# Patient Record
Sex: Female | Born: 1950 | Race: White | Hispanic: No | Marital: Married | Smoking: Never smoker
Health system: Southern US, Community
[De-identification: ages and names within clinical notes are randomized; demographics above are authoritative.]

## PROBLEM LIST (undated history)

## (undated) DIAGNOSIS — Z8489 Family history of other specified conditions: Secondary | ICD-10-CM

## (undated) DIAGNOSIS — R112 Nausea with vomiting, unspecified: Secondary | ICD-10-CM

## (undated) DIAGNOSIS — R51 Headache: Secondary | ICD-10-CM

## (undated) DIAGNOSIS — Z9889 Other specified postprocedural states: Secondary | ICD-10-CM

## (undated) DIAGNOSIS — E785 Hyperlipidemia, unspecified: Secondary | ICD-10-CM

## (undated) DIAGNOSIS — T148XXA Other injury of unspecified body region, initial encounter: Secondary | ICD-10-CM

## (undated) DIAGNOSIS — N209 Urinary calculus, unspecified: Secondary | ICD-10-CM

## (undated) DIAGNOSIS — T8859XA Other complications of anesthesia, initial encounter: Secondary | ICD-10-CM

## (undated) DIAGNOSIS — T4145XA Adverse effect of unspecified anesthetic, initial encounter: Secondary | ICD-10-CM

## (undated) DIAGNOSIS — K219 Gastro-esophageal reflux disease without esophagitis: Secondary | ICD-10-CM

## (undated) DIAGNOSIS — R519 Headache, unspecified: Secondary | ICD-10-CM

## (undated) DIAGNOSIS — M1990 Unspecified osteoarthritis, unspecified site: Secondary | ICD-10-CM

## (undated) DIAGNOSIS — Z87442 Personal history of urinary calculi: Secondary | ICD-10-CM

## (undated) DIAGNOSIS — I10 Essential (primary) hypertension: Secondary | ICD-10-CM

## (undated) DIAGNOSIS — E039 Hypothyroidism, unspecified: Secondary | ICD-10-CM

## (undated) HISTORY — PX: TOOTH EXTRACTION: SUR596

## (undated) HISTORY — PX: DILATION AND CURETTAGE, DIAGNOSTIC / THERAPEUTIC: SUR384

## (undated) HISTORY — DX: Other injury of unspecified body region, initial encounter: T14.8XXA

## (undated) HISTORY — DX: Unspecified osteoarthritis, unspecified site: M19.90

## (undated) HISTORY — DX: Headache, unspecified: R51.9

## (undated) HISTORY — DX: Essential (primary) hypertension: I10

## (undated) HISTORY — DX: Headache: R51

## (undated) HISTORY — DX: Urinary calculus, unspecified: N20.9

## (undated) HISTORY — PX: TUBAL LIGATION: SHX77

## (undated) HISTORY — DX: Hyperlipidemia, unspecified: E78.5

## (undated) HISTORY — DX: Hypothyroidism, unspecified: E03.9

---

## 1981-12-23 HISTORY — PX: SALPINGOOPHORECTOMY: SHX82

## 1981-12-23 HISTORY — PX: TOTAL ABDOMINAL HYSTERECTOMY: SHX209

## 1995-12-24 HISTORY — PX: URETERAL STENT PLACEMENT: SHX822

## 2008-07-06 ENCOUNTER — Ambulatory Visit: Payer: Self-pay | Admitting: Unknown Physician Specialty

## 2009-08-29 ENCOUNTER — Ambulatory Visit: Payer: Self-pay | Admitting: Unknown Physician Specialty

## 2010-09-11 ENCOUNTER — Ambulatory Visit: Payer: Self-pay | Admitting: Unknown Physician Specialty

## 2011-12-24 DIAGNOSIS — T148XXA Other injury of unspecified body region, initial encounter: Secondary | ICD-10-CM

## 2011-12-24 HISTORY — PX: COLONOSCOPY: SHX174

## 2011-12-24 HISTORY — DX: Other injury of unspecified body region, initial encounter: T14.8XXA

## 2012-02-04 ENCOUNTER — Ambulatory Visit: Payer: Self-pay | Admitting: Unknown Physician Specialty

## 2015-04-14 DIAGNOSIS — E039 Hypothyroidism, unspecified: Secondary | ICD-10-CM | POA: Insufficient documentation

## 2016-07-02 DIAGNOSIS — G44009 Cluster headache syndrome, unspecified, not intractable: Secondary | ICD-10-CM | POA: Insufficient documentation

## 2016-07-02 DIAGNOSIS — F5101 Primary insomnia: Secondary | ICD-10-CM | POA: Insufficient documentation

## 2016-07-03 DIAGNOSIS — M7989 Other specified soft tissue disorders: Secondary | ICD-10-CM | POA: Insufficient documentation

## 2017-04-01 ENCOUNTER — Other Ambulatory Visit: Payer: Self-pay | Admitting: Certified Nurse Midwife

## 2017-04-07 ENCOUNTER — Other Ambulatory Visit: Payer: Self-pay | Admitting: Certified Nurse Midwife

## 2017-04-08 ENCOUNTER — Other Ambulatory Visit: Payer: Self-pay | Admitting: Certified Nurse Midwife

## 2017-04-08 NOTE — Telephone Encounter (Signed)
Please advise for refill. Thank you.  

## 2017-04-08 NOTE — Telephone Encounter (Signed)
Needs annual scheduled

## 2017-04-08 NOTE — Telephone Encounter (Signed)
Left msg for pt that she needs annual for additional refills.

## 2017-05-14 ENCOUNTER — Encounter: Payer: Self-pay | Admitting: Certified Nurse Midwife

## 2017-05-14 ENCOUNTER — Ambulatory Visit (INDEPENDENT_AMBULATORY_CARE_PROVIDER_SITE_OTHER): Payer: BLUE CROSS/BLUE SHIELD | Admitting: Certified Nurse Midwife

## 2017-05-14 VITALS — BP 128/78 | HR 90 | Ht 64.0 in | Wt 220.0 lb

## 2017-05-14 DIAGNOSIS — M8949 Other hypertrophic osteoarthropathy, multiple sites: Secondary | ICD-10-CM | POA: Insufficient documentation

## 2017-05-14 DIAGNOSIS — M159 Polyosteoarthritis, unspecified: Secondary | ICD-10-CM | POA: Insufficient documentation

## 2017-05-14 DIAGNOSIS — Z1239 Encounter for other screening for malignant neoplasm of breast: Secondary | ICD-10-CM

## 2017-05-14 DIAGNOSIS — Z1231 Encounter for screening mammogram for malignant neoplasm of breast: Secondary | ICD-10-CM

## 2017-05-14 DIAGNOSIS — K219 Gastro-esophageal reflux disease without esophagitis: Secondary | ICD-10-CM | POA: Insufficient documentation

## 2017-05-14 DIAGNOSIS — E782 Mixed hyperlipidemia: Secondary | ICD-10-CM | POA: Insufficient documentation

## 2017-05-14 DIAGNOSIS — Z01419 Encounter for gynecological examination (general) (routine) without abnormal findings: Secondary | ICD-10-CM | POA: Diagnosis not present

## 2017-05-14 DIAGNOSIS — N951 Menopausal and female climacteric states: Secondary | ICD-10-CM | POA: Insufficient documentation

## 2017-05-14 DIAGNOSIS — M150 Primary generalized (osteo)arthritis: Secondary | ICD-10-CM

## 2017-05-14 NOTE — Progress Notes (Signed)
Gynecology Annual Exam  PCP: [facility] Chief Complaint:  Chief Complaint  Patient presents with  . Gynecologic Exam    History of Present Illness: Aimee Hill presents today for her annual exam. She is a 66 year old White female , G 1 P 1 0 0 1 , whose LMP was in 1983 after a TAH and RSO for an ovarian mass . She is currently using Vivelle dot patches 0.0375. Which does control the majority of hot flashes. Since her last visit, her mother passed away from an internal hemorrhage in August 2017.  She has had no spotting.  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  Since her last annual GYN exam dated 02/28/2016, she has been diagnosed with hypertension and hyperlipidemia. She is taking Pravastatin and Verapamil  Her last mammogram was 02/28/2016 and was benign with no significant changes. There is no family history of breast or ovarian cancer. She does do monthly self breast exams She does not have Pap smears since her TAH. She had a DEXA scan done in 2013 that was normal. Her next is due at age 88. The patient does not smoke or drink alcohol. She does not exercise, works 12 hour days. She does not get adequate calcium in her diet. Her current BMI=37.76 kg/m2   The patient denies current symptoms of depression.    Review of Systems: Review of Systems  Constitutional: Negative for chills, fever and weight loss.  HENT: Positive for sinus pain. Negative for congestion and sore throat.   Eyes: Negative for blurred vision and pain.  Respiratory: Negative for hemoptysis, shortness of breath and wheezing.   Cardiovascular: Negative for chest pain, palpitations and leg swelling.  Gastrointestinal: Negative for abdominal pain, blood in stool, diarrhea, heartburn, nausea and vomiting.  Genitourinary: Negative for dysuria, frequency, hematuria and  urgency.  Musculoskeletal: Positive for joint pain and neck pain. Negative for back pain and myalgias.  Skin: Negative for itching and rash.  Neurological: Negative for dizziness, tingling and headaches.  Endo/Heme/Allergies: Negative for environmental allergies and polydipsia. Does not bruise/bleed easily.       Negative for hirsutism   Psychiatric/Behavioral: Negative for depression. The patient is not nervous/anxious and does not have insomnia.     Past Medical History:  Past Medical History:  Diagnosis Date  . Fracture 2013   left foot and second and third toes on left foot  . Headache   . Hyperlipidemia   . Hypertension   . Hypothyroidism   . Osteoarthritis   . Urinary calculus     Past Surgical History:  Past Surgical History:  Procedure Laterality Date  . COLONOSCOPY  2013   negative/Dr. Kathyrn Sheriff [place]-next one due 2023  . DILATION AND CURETTAGE, DIAGNOSTIC / THERAPEUTIC    . 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 TOOTH EXTRACTION    . TOTAL ABDOMINAL HYSTERECTOMY  1983   Dr. Rayford Halsted  . TUBAL LIGATION    . URETERAL STENT PLACEMENT  1997    Family History:  Family History  Problem Relation Age of Onset  . Alzheimer's disease Mother        died at age 6 from internal bleeding  . Rheum arthritis Mother   . Brain cancer Father   . Lymphoma Paternal Aunt 39    Social History:  Social History   Social History  .  Marital status: Married    Spouse name: N/A  . Number of children: 1  . Years of education: 51   Occupational History  . Network engineer    Social History Main Topics  . Smoking status: Never Smoker  . Smokeless tobacco: Never Used  . Alcohol use No  . Drug use: No  . Sexual activity: Yes    Partners: Male    Birth control/ protection: Surgical   Other Topics Concern  . Not on file   Social History Narrative  . No narrative on file    Allergies:  Allergies  Allergen Reactions  . Penicillins Hives     INJECTION ONLY    Medications: Current Outpatient Prescriptions:  .  furosemide (LASIX) 20 MG tablet, TAKE ONE (1) TABLET EACH DAY, Disp: , Rfl:  .  meloxicam (MOBIC) 15 MG tablet, TAKE ONE (1) TABLET EACH DAY, Disp: , Rfl:  .  metaxalone (SKELAXIN) 800 MG tablet, TAKE ONE TABLET BY MOUTH THREE TIMES A DAY AS NEEDED FOR PAIN, Disp: , Rfl:  .  Multiple Vitamin (MULTIVITAMIN) tablet, Take 1 tablet by mouth daily., Disp: , Rfl:  .  omeprazole (PRILOSEC) 20 MG capsule, Take 20 mg by mouth daily., Disp: , Rfl:  .  SUMAtriptan (IMITREX) 50 MG tablet, Take by mouth., Disp: , Rfl:  .  gabapentin (NEURONTIN) 100 MG capsule, , Disp: , Rfl: 4 .  levothyroxine (SYNTHROID, LEVOTHROID) 50 MCG tablet, , Disp: , Rfl: 10 .  pravastatin (PRAVACHOL) 10 MG tablet, , Disp: , Rfl: 10 .  VIVELLE-DOT 0.0375 MG/24HR, APPLY ONE PATCH TRANSDERMAL ROUTE TWICE WEEKLY., Disp: 24 patch, Rfl: 0 Verapamil 120 mgm daily Physical Exam Vitals: Blood pressure 128/78, pulse 90, height 5\' 4"  (1.626 m), weight 220 lb (99.8 kg), BMI 37.76 kg/m2  General: pleasant WF in NAD HEENT: normocephalic, anicteric Neck: no thyroid enlargement, no palpable nodules, no cervical lymphadenopathy  Pulmonary: No increased work of breathing, CTAB Cardiovascular: RRR, without murmur  Breast: Breast symmetrical, no tenderness, no palpable nodules or masses, no skin or nipple retraction present, no nipple discharge.  No axillary, infraclavicular or supraclavicular lymphadenopathy. Abdomen: Soft, non-tender, non-distended, obese.  Umbilicus without lesions.  No hepatomegaly or masses palpable. No evidence of hernia. Genitourinary:  External: Normal external female genitalia.  Normal urethral meatus, normal Bartholin's and Skene's glands.    Vagina: Normal vaginal mucosa, no evidence of prolapse.    Cervix: surgically absent  Uterus: surgically absent  Adnexa: No adnexal masses, non-tender  Rectal: deferred  Lymphatic: no evidence of inguinal  lymphadenopathy Extremities: no edema, erythema, or tenderness Neurologic: Grossly intact Psychiatric: mood appropriate, affect full     Assessment: 66 y.o. G1P1001 well woman exam  Plan:   1) Breast cancer screening - recommend monthly self breast exam. Mammogram was ordered today. 2) Refills for Vivelle Dot 0.0375 sent to pharmacy 3)) Routine healthcare maintenance including cholesterol and diabetes screening managed by PCP  4) Next colonoscopy due 2023 5) Next DEXA scan in 2 years  Dalia Heading, [place]

## 2017-05-18 ENCOUNTER — Encounter: Payer: Self-pay | Admitting: Certified Nurse Midwife

## 2017-05-18 DIAGNOSIS — I10 Essential (primary) hypertension: Secondary | ICD-10-CM | POA: Insufficient documentation

## 2017-05-18 MED ORDER — VIVELLE-DOT 0.0375 MG/24HR TD PTTW: MEDICATED_PATCH | TRANSDERMAL | 3 refills | Status: DC

## 2017-07-07 DIAGNOSIS — K5730 Diverticulosis of large intestine without perforation or abscess without bleeding: Secondary | ICD-10-CM | POA: Insufficient documentation

## 2017-07-07 DIAGNOSIS — E559 Vitamin D deficiency, unspecified: Secondary | ICD-10-CM | POA: Insufficient documentation

## 2017-07-10 DIAGNOSIS — Z8619 Personal history of other infectious and parasitic diseases: Secondary | ICD-10-CM | POA: Insufficient documentation

## 2018-05-29 ENCOUNTER — Other Ambulatory Visit: Payer: Self-pay | Admitting: Certified Nurse Midwife

## 2018-05-29 DIAGNOSIS — Z1231 Encounter for screening mammogram for malignant neoplasm of breast: Secondary | ICD-10-CM

## 2018-06-08 ENCOUNTER — Telehealth: Payer: Self-pay

## 2018-06-08 MED ORDER — VIVELLE-DOT 0.0375 MG/24HR TD PTTW: MEDICATED_PATCH | TRANSDERMAL | 0 refills | Status: DC

## 2018-06-08 NOTE — Telephone Encounter (Signed)
rx sent to last until appt on 07/28/18

## 2018-06-08 NOTE — Telephone Encounter (Signed)
Pt calling for a refill on her patch, she has a annual scheduled for 07/28/2018,

## 2018-06-08 NOTE — Telephone Encounter (Signed)
Tried to call pt to notify rx sent. No answer.

## 2018-06-30 ENCOUNTER — Ambulatory Visit
Admission: RE | Admit: 2018-06-30 | Discharge: 2018-06-30 | Disposition: A | Discharge status: Home / Self Care | Payer: BLUE CROSS/BLUE SHIELD | Source: Ambulatory Visit | Attending: Certified Nurse Midwife | Admitting: Certified Nurse Midwife

## 2018-06-30 DIAGNOSIS — Z1231 Encounter for screening mammogram for malignant neoplasm of breast: Secondary | ICD-10-CM | POA: Insufficient documentation

## 2018-06-30 IMAGING — MG MM DIGITAL SCREENING BILAT W/ TOMO W/ CAD
8 of 13 series · 8 of 29 positions shown · non-contrast
Comparison: Previous exam(s).

CLINICAL DATA: Screening.

EXAM:
DIGITAL SCREENING BILATERAL MAMMOGRAM WITH TOMO AND CAD

[L MLO synth-2D]
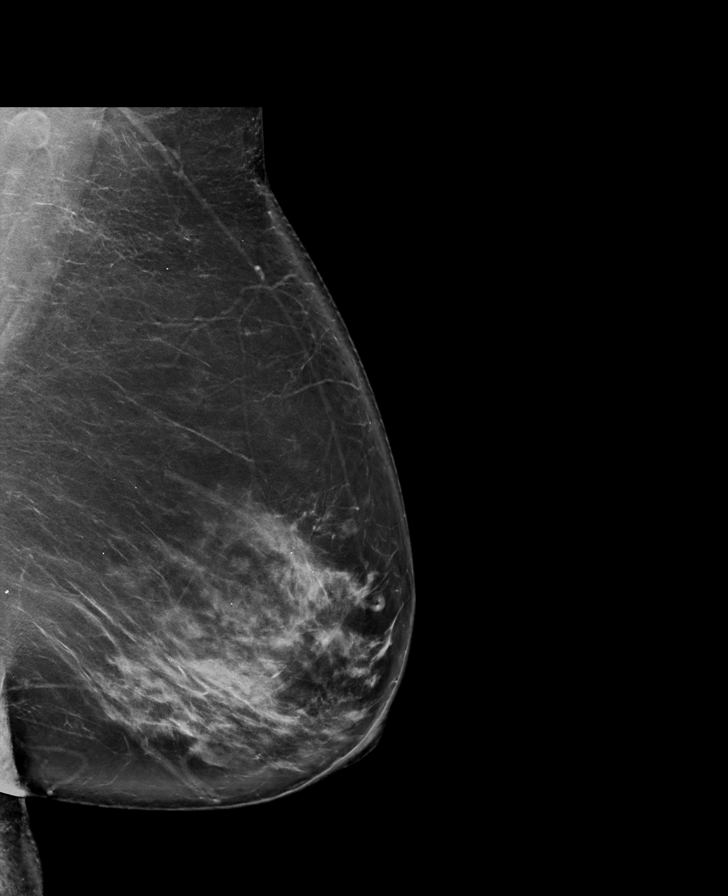

[R CC synth-2D]
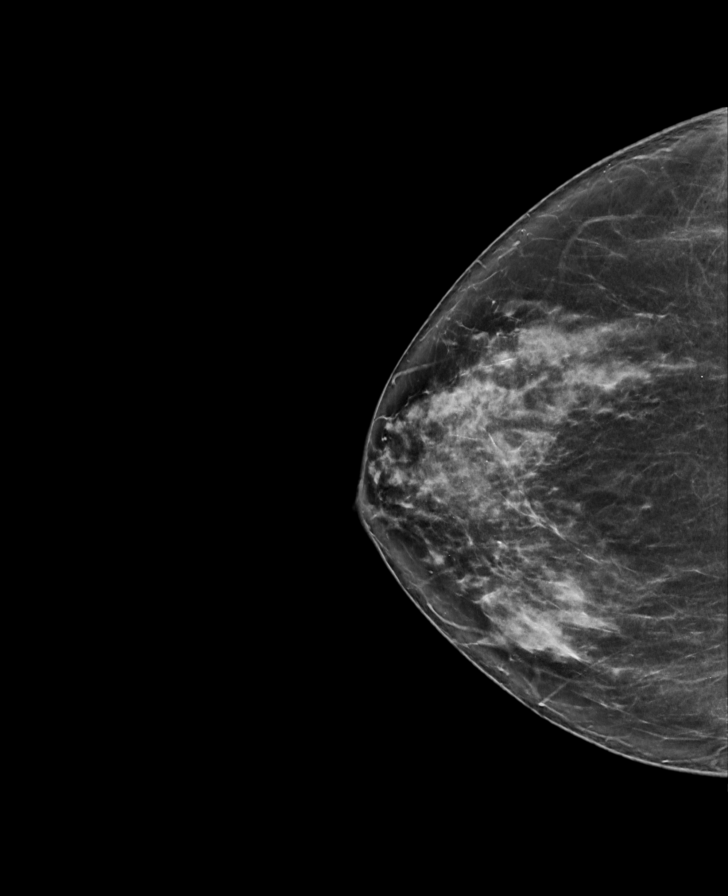

[R MLO synth-2D]
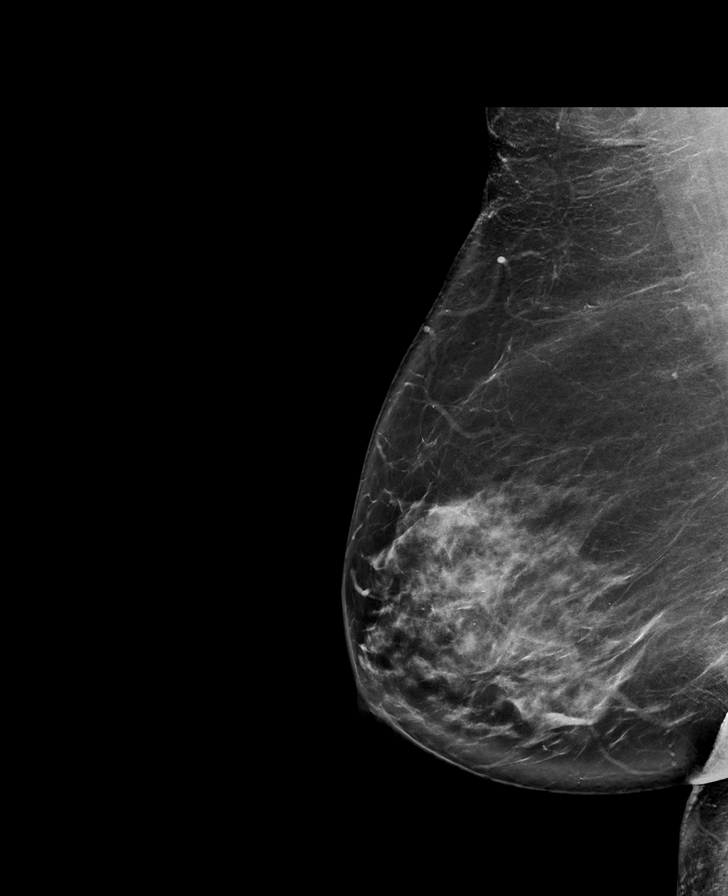

[L CC synth-2D]
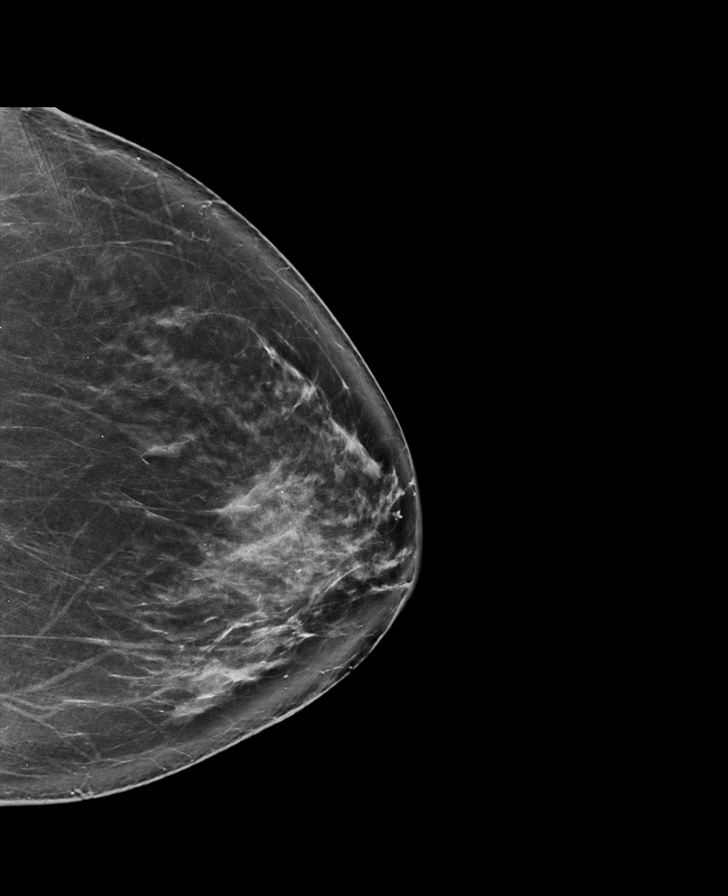

[L CC (1 of 2)]
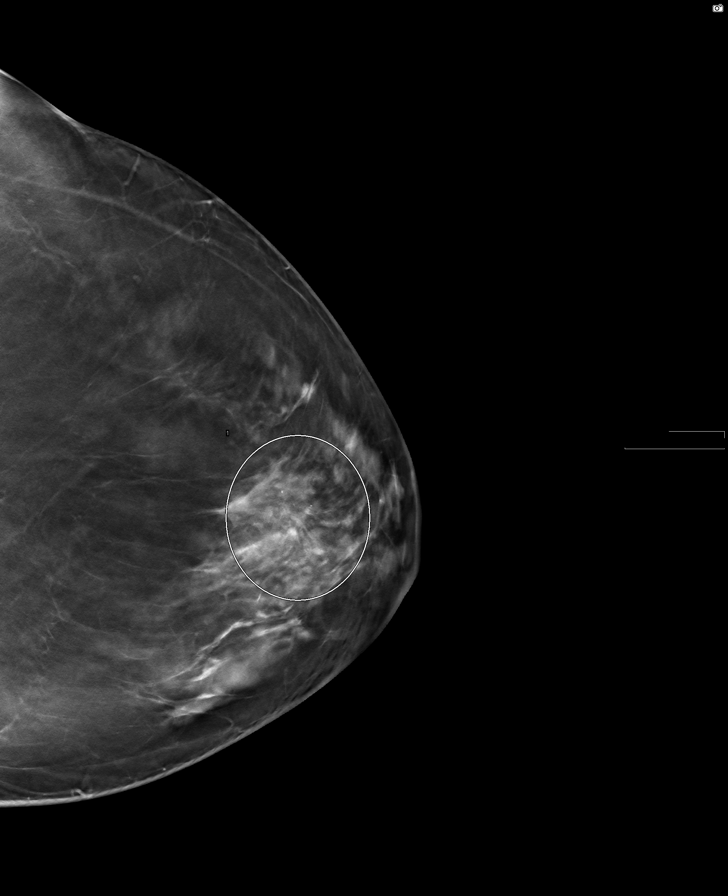

[L MLO (1 of 2)]
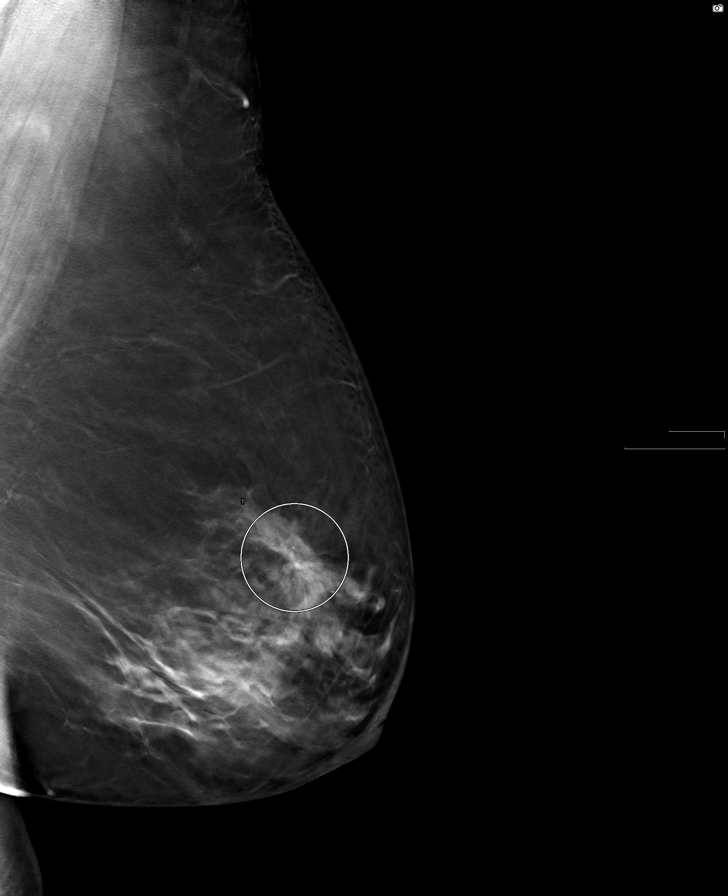

[L MLO (2 of 2)]
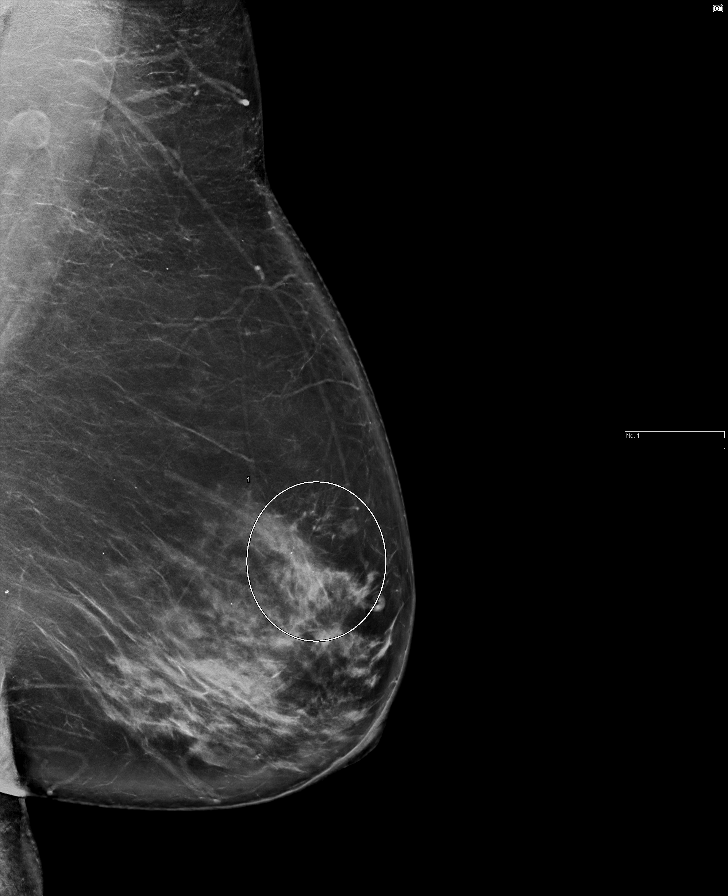

[L CC (2 of 2)]
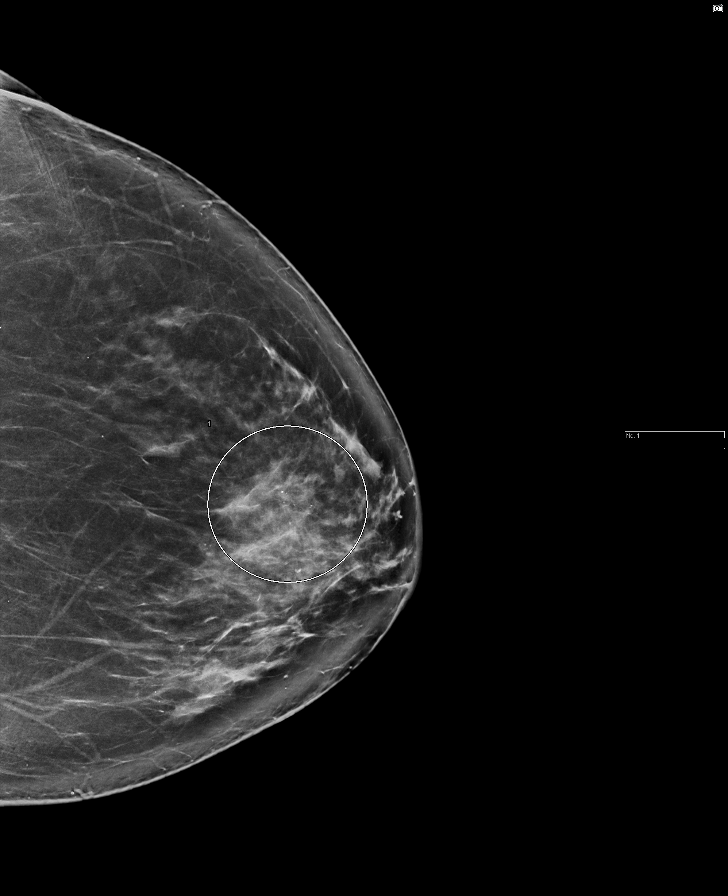

[8 of 29 positions shown; findings below may reference images not displayed]

ACR Breast Density Category c: The breast tissue is heterogeneously
dense, which may obscure small masses.
FINDINGS: In the left breast, possible distortion and calcifications warrant
further evaluation. In the right breast, no findings suspicious for
malignancy. Images were processed with CAD.
IMPRESSION: Further evaluation is suggested for possible distortion in the left
breast.

RECOMMENDATION:
Diagnostic mammogram and possibly ultrasound of the left breast.
(Code:[DO])

The patient will be contacted regarding the findings, and additional
imaging will be scheduled.

BI-RADS CATEGORY  0: Incomplete. Need additional imaging evaluation
and/or prior mammograms for comparison.

## 2018-07-02 ENCOUNTER — Inpatient Hospital Stay
Admission: RE | Admit: 2018-07-02 | Discharge: 2018-07-02 | Disposition: A | Discharge status: Home / Self Care | Payer: Self-pay | Source: Ambulatory Visit | Attending: *Deleted | Admitting: *Deleted

## 2018-07-02 ENCOUNTER — Other Ambulatory Visit: Payer: Self-pay | Admitting: *Deleted

## 2018-07-02 DIAGNOSIS — Z9289 Personal history of other medical treatment: Secondary | ICD-10-CM

## 2018-07-03 ENCOUNTER — Other Ambulatory Visit: Payer: Self-pay | Admitting: Certified Nurse Midwife

## 2018-07-03 DIAGNOSIS — R928 Other abnormal and inconclusive findings on diagnostic imaging of breast: Secondary | ICD-10-CM

## 2018-07-07 ENCOUNTER — Other Ambulatory Visit: Payer: Self-pay | Admitting: Certified Nurse Midwife

## 2018-07-07 ENCOUNTER — Ambulatory Visit
Admission: RE | Admit: 2018-07-07 | Discharge: 2018-07-07 | Disposition: A | Discharge status: Home / Self Care | Payer: BLUE CROSS/BLUE SHIELD | Source: Ambulatory Visit | Attending: Certified Nurse Midwife | Admitting: Certified Nurse Midwife

## 2018-07-07 DIAGNOSIS — R928 Other abnormal and inconclusive findings on diagnostic imaging of breast: Secondary | ICD-10-CM

## 2018-07-07 DIAGNOSIS — R921 Mammographic calcification found on diagnostic imaging of breast: Secondary | ICD-10-CM

## 2018-07-07 IMAGING — US US BREAST*L* LIMITED INC AXILLA
1 series · 5 of 5 positions shown · non-contrast
Comparison: Previous exam(s).

CLINICAL DATA: Patient presents for additional views of the left
breast as follow-up to recent screening exam to evaluate possible
distortion.

EXAM:
DIGITAL DIAGNOSTIC left MAMMOGRAM
ULTRASOUND left BREAST

[Series 1: us breast*left* limited inc axilla · 0.09mm/px · 5 of 5 slices shown]
[im 1/5]
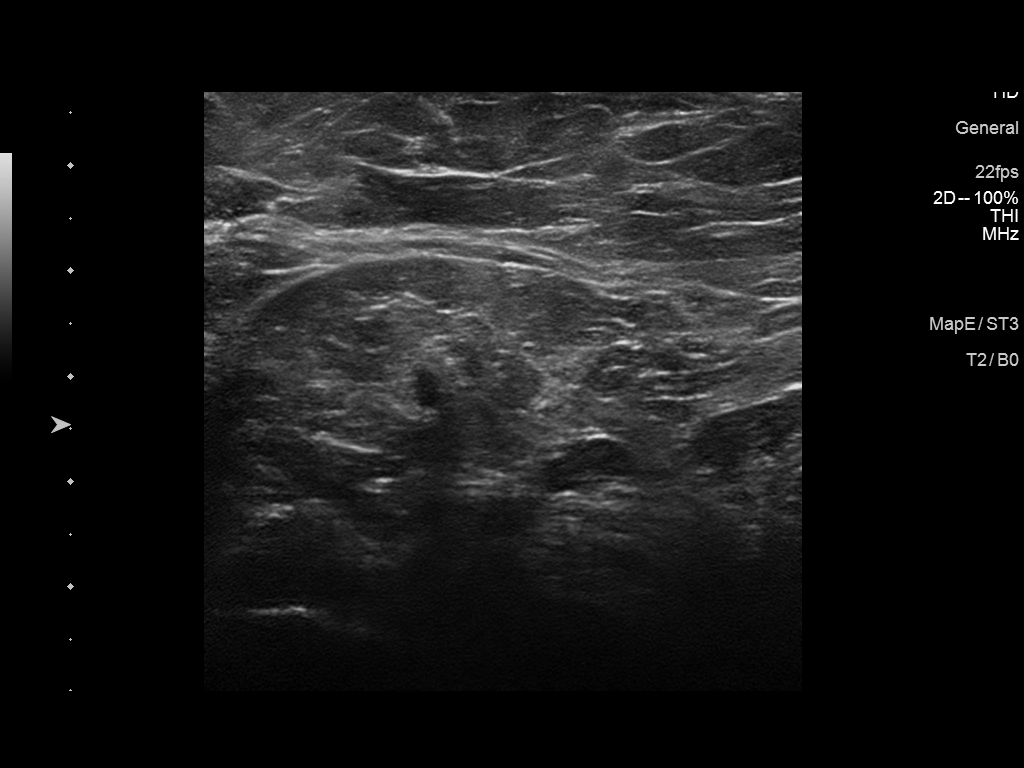
[im 2/5]
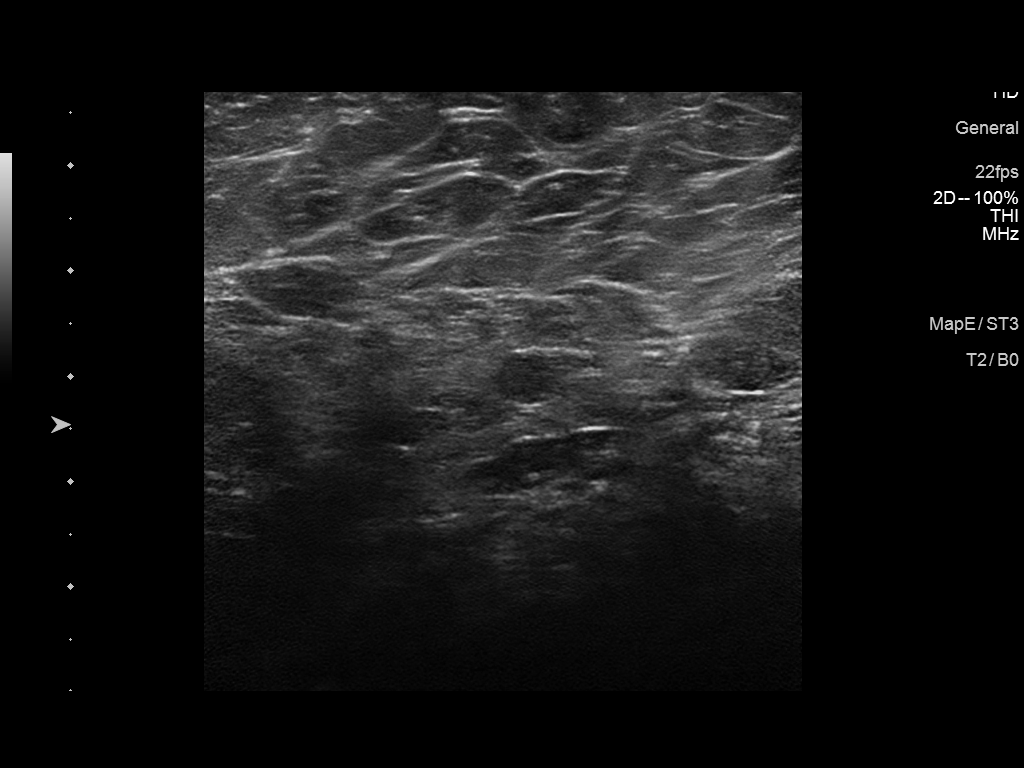
[im 3/5]
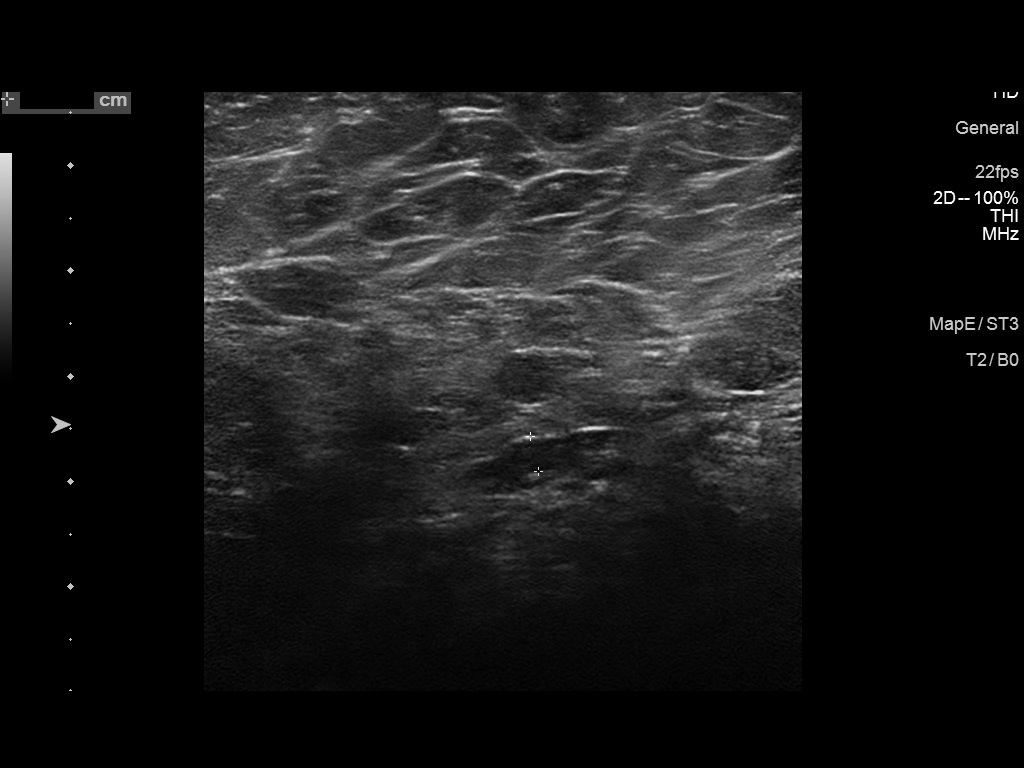
[im 4/5]
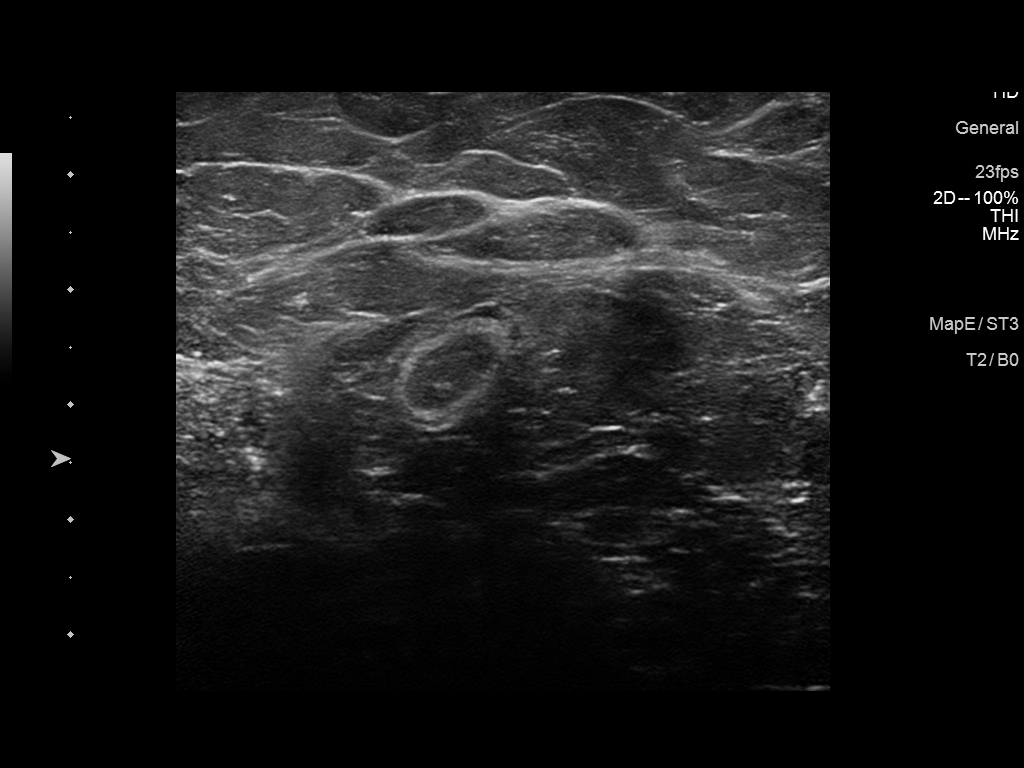
[im 5/5]
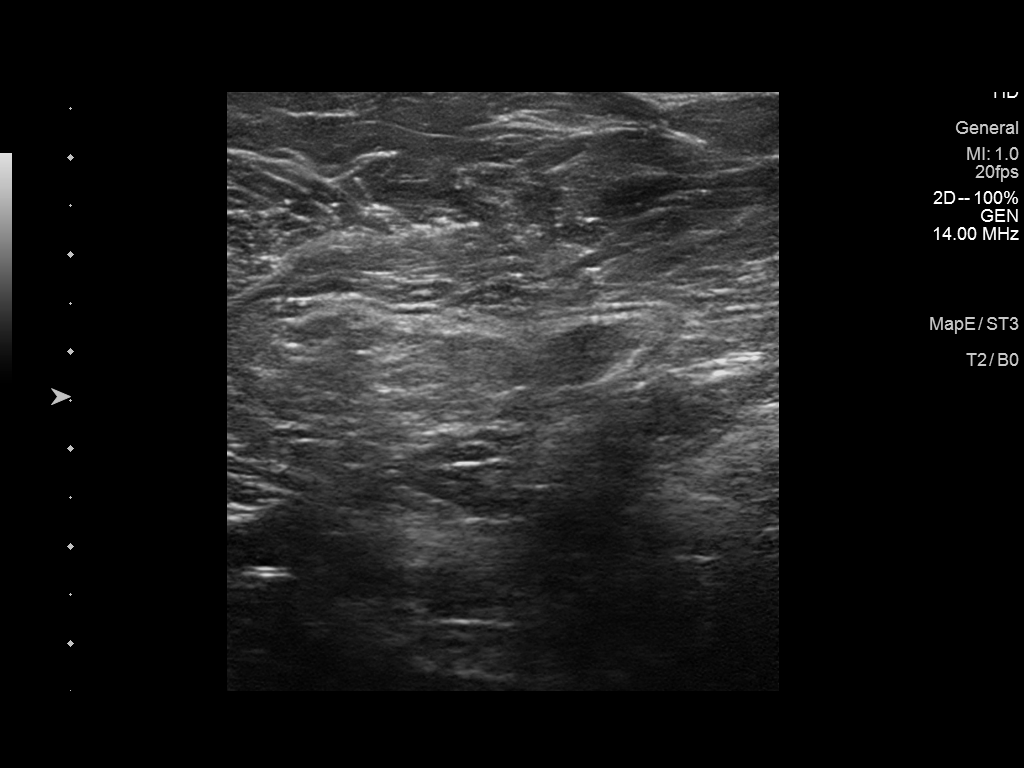

[5 of 5 positions shown; findings below may reference images not displayed]

ACR Breast Density Category c: The breast tissue is heterogeneously
dense, which may obscure small masses.
FINDINGS: Additional images with 3D imaging demonstrate subtle persistent
distortion over the upper central left breast with associated
punctate microcalcifications.

Targeted ultrasound is performed, showing no focal abnormality over
the upper central left breast to correlate with the mammographic
finding.

Ultrasound of the left axilla is normal.
IMPRESSION: Persistent subtle focal distortion over the upper central left
breast with associated microcalcifications.

RECOMMENDATION:
Recommend 3D guided stereotactic needle biopsy for further
evaluation. Patient states desire for possible excisional biopsy.
Patient was counseled as to the nature of the procedure regarding
needle biopsy versus excisional biopsy. She wishes to call back with
her decision of which type of biopsy to pursue and schedule at that
time.

I have discussed the findings and recommendations with the patient.
Results were also provided in writing at the conclusion of the
visit. If applicable, a reminder letter will be sent to the patient
regarding the next appointment.

BI-RADS CATEGORY  4: Suspicious.

## 2018-07-07 IMAGING — MG MM DIGITAL DIAGNOSTIC UNILAT*L* W/ TOMO W/ CAD
8 of 16 series · 8 of 36 positions shown · non-contrast
Comparison: Previous exam(s).

CLINICAL DATA: Patient presents for additional views of the left
breast as follow-up to recent screening exam to evaluate possible
distortion.

EXAM:
DIGITAL DIAGNOSTIC left MAMMOGRAM
ULTRASOUND left BREAST

[L ML synth-2D]
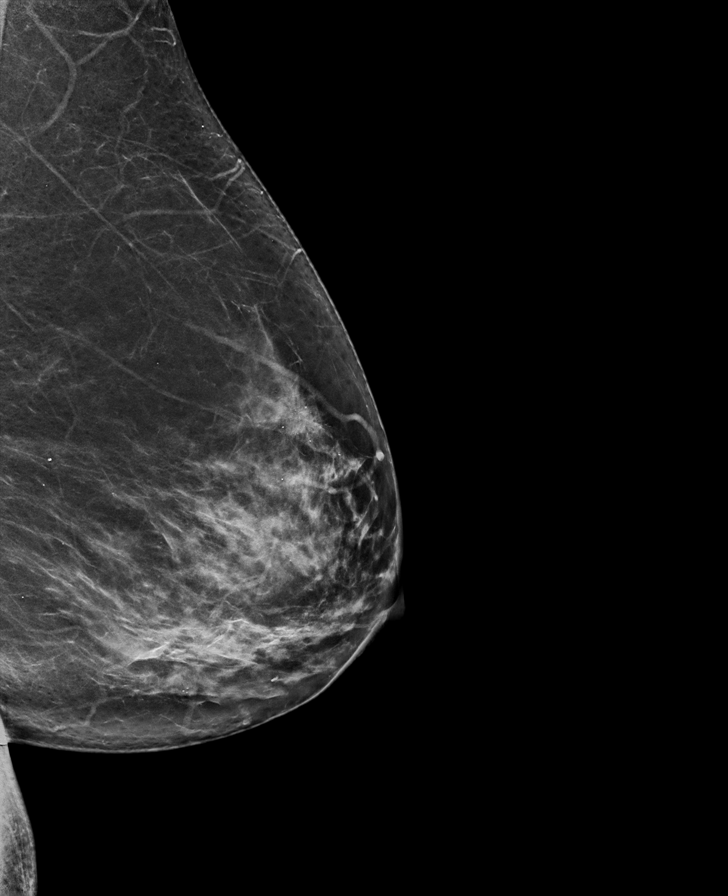

[L MLO synth-2D (1 of 2)]
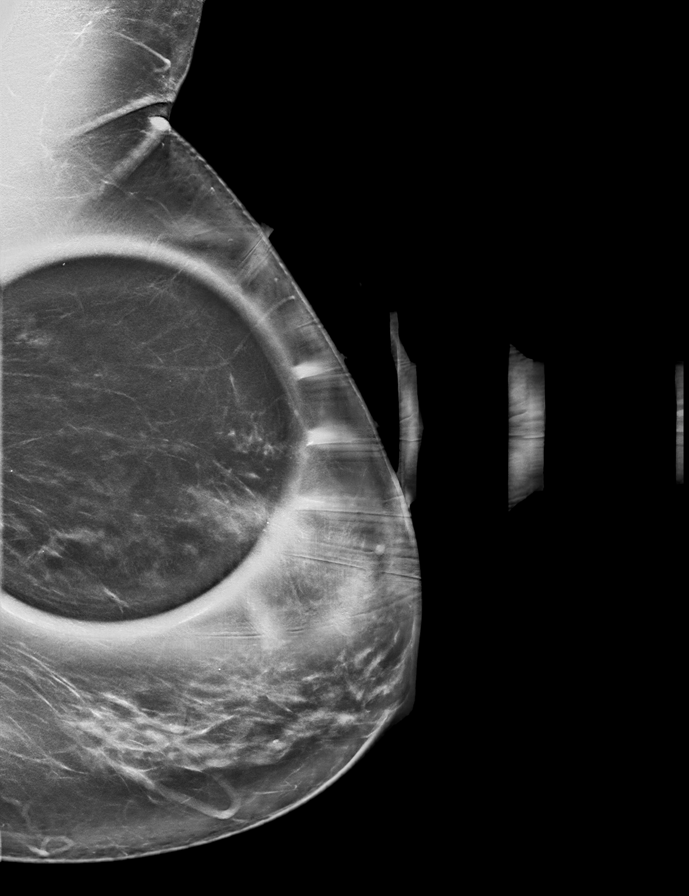

[L CC synth-2D (1 of 2)]
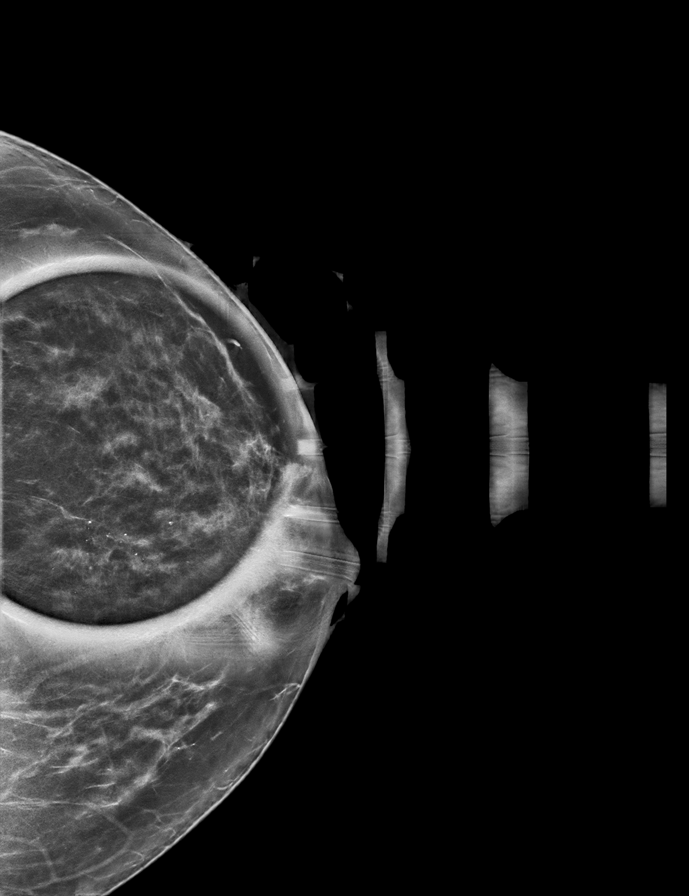

[L CC synth-2D (2 of 2)]
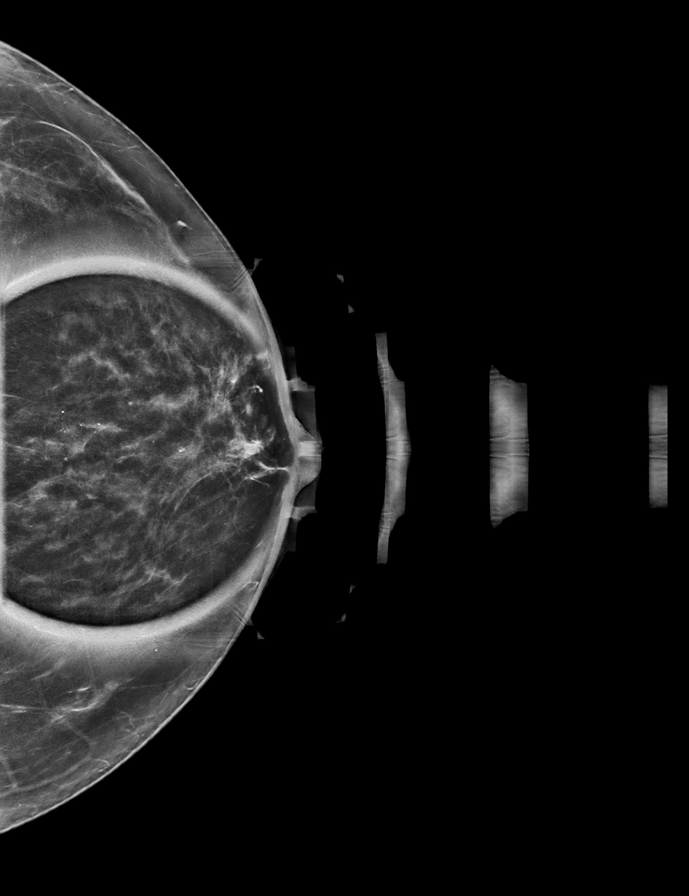

[L MLO synth-2D (2 of 2)]
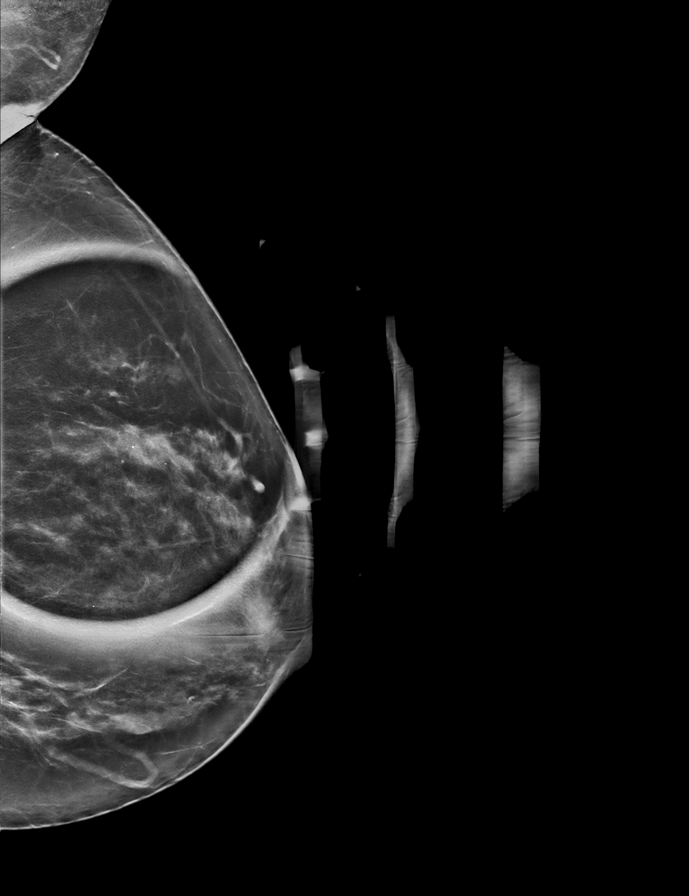

[L ML (1 of 2)]
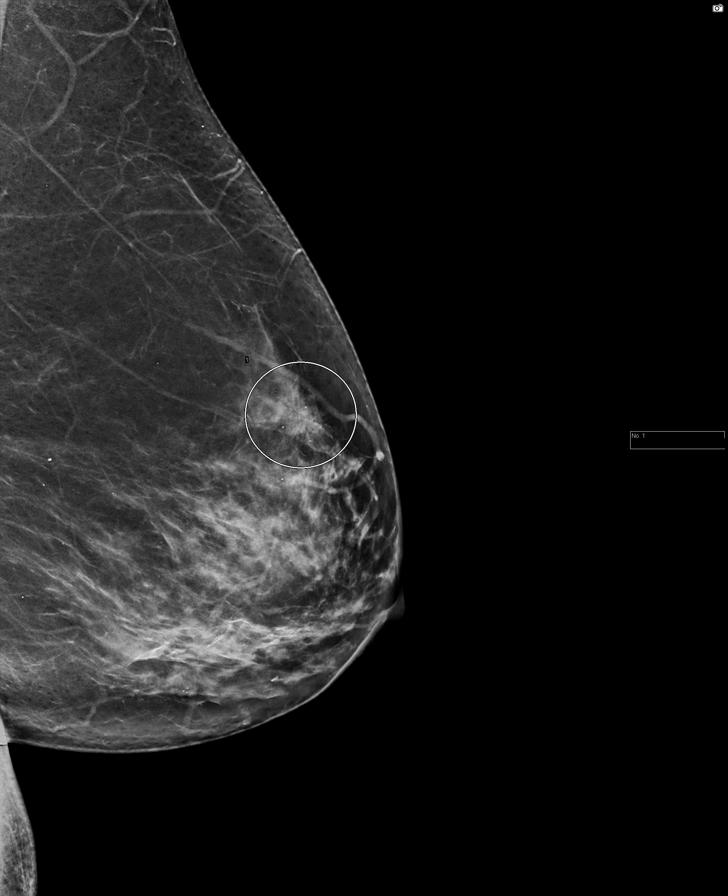

[L MLO]
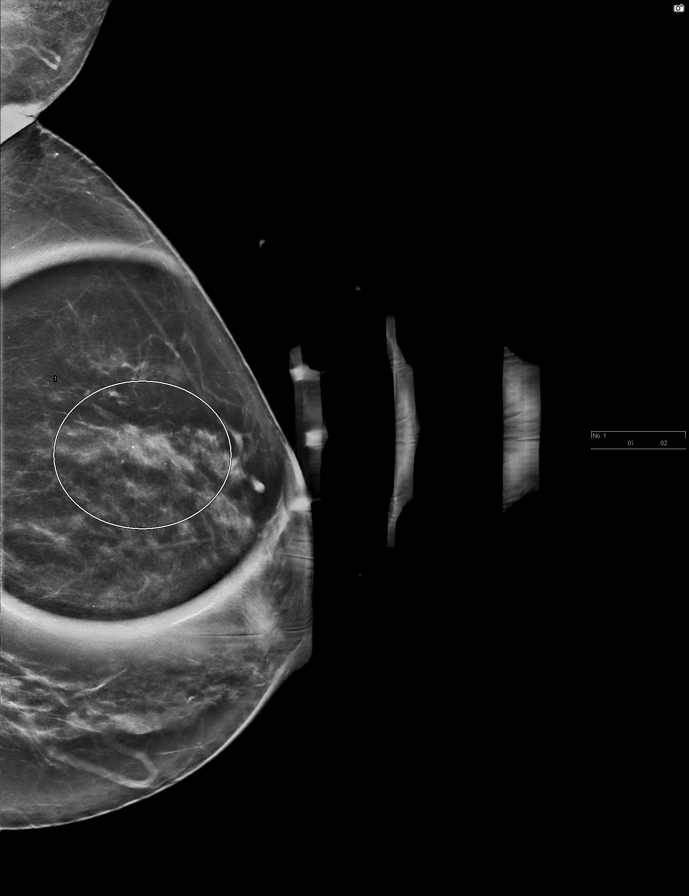

[L ML (2 of 2)]
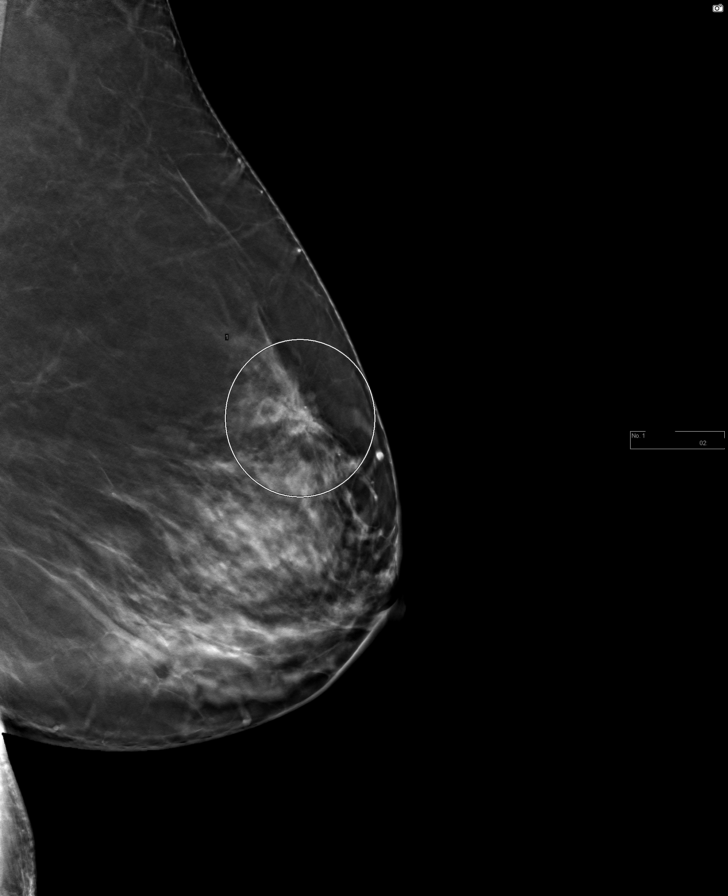

[8 of 36 positions shown; findings below may reference images not displayed]

ACR Breast Density Category c: The breast tissue is heterogeneously
dense, which may obscure small masses.
FINDINGS: Additional images with 3D imaging demonstrate subtle persistent
distortion over the upper central left breast with associated
punctate microcalcifications.

Targeted ultrasound is performed, showing no focal abnormality over
the upper central left breast to correlate with the mammographic
finding.

Ultrasound of the left axilla is normal.
IMPRESSION: Persistent subtle focal distortion over the upper central left
breast with associated microcalcifications.

RECOMMENDATION:
Recommend 3D guided stereotactic needle biopsy for further
evaluation. Patient states desire for possible excisional biopsy.
Patient was counseled as to the nature of the procedure regarding
needle biopsy versus excisional biopsy. She wishes to call back with
her decision of which type of biopsy to pursue and schedule at that
time.

I have discussed the findings and recommendations with the patient.
Results were also provided in writing at the conclusion of the
visit. If applicable, a reminder letter will be sent to the patient
regarding the next appointment.

BI-RADS CATEGORY  4: Suspicious.

## 2018-07-08 ENCOUNTER — Telehealth: Payer: Self-pay | Admitting: Certified Nurse Midwife

## 2018-07-08 DIAGNOSIS — R921 Mammographic calcification found on diagnostic imaging of breast: Secondary | ICD-10-CM

## 2018-07-08 DIAGNOSIS — N6320 Unspecified lump in the left breast, unspecified quadrant: Secondary | ICD-10-CM

## 2018-07-08 NOTE — Telephone Encounter (Signed)
Patient called regarding results of additional views of mammogram and ultrasound on left breast. There is a persistent distortion in the left breast with some microcalcifications. Radiologist rated mammogram Birads 4 and recommended stereotactic biopsy. DIscussed options. Mutual decision made to refer to Dr Bary Castilla. Dalia Heading, CNM

## 2018-07-16 ENCOUNTER — Other Ambulatory Visit: Payer: Self-pay | Admitting: General Surgery

## 2018-07-16 ENCOUNTER — Encounter: Payer: Self-pay | Admitting: General Surgery

## 2018-07-16 ENCOUNTER — Ambulatory Visit: Payer: BLUE CROSS/BLUE SHIELD | Admitting: General Surgery

## 2018-07-16 VITALS — BP 128/74 | HR 89 | Resp 14 | Ht 64.0 in | Wt 214.0 lb

## 2018-07-16 DIAGNOSIS — R920 Mammographic microcalcification found on diagnostic imaging of breast: Secondary | ICD-10-CM

## 2018-07-16 NOTE — Patient Instructions (Addendum)
The patient is aware to call back for any questions or concerns.  The patient will be scheduled for a stereotactic left breast biopsy.  Stereotactic Breast Biopsy A breast biopsy is a procedure in which a sample of suspicious breast tissue is removed from your breast. In a stereotactic breast biopsy, an X-ray of the breast (mammogram) is used during the procedure to locate the area of the breast where the tissue sample will be taken. After the procedure, the tissue that is removed from the breast is examined under a microscope to see if cancerous cells are present. You may need a stereotactic breast biopsy if you have:  Any undiagnosed breast mass (tumor).  Calcium deposits (calcifications) or abnormalities seen on a mammogram, ultrasound results, or MRI results.  Nipple abnormalities, dimpling, crusting, or ulcerations.  Abnormal discharge from the nipple, especially blood.  Redness, swelling, and pain of the breast.  Suspicious changes in the breast seen on your mammogram.  If the breast abnormality is found to be cancerous (malignant), a stereotactic breast biopsy can help to determine what the best treatment is for you. Tell a health care provider about:  Any allergies you have.  All medicines you are taking, including vitamins, herbs, eye drops, creams, and over-the-counter medicines.  Any problems you or family members have had with anesthetic medicines.  Any blood disorders you have.  Any surgeries you have had.  Any medical conditions you have.  Whether you are pregnant or may be pregnant. What are the risks? Generally, this is a safe procedure. However, problems may occur, including:  Infection at the needle-insertion site.  Bleeding.  Soreness.  Allergic reactions to medicines.  Bruising and swelling of the breast.  Change in the shape of the breast.  Damage to other tissues.  What happens before the procedure?  Ask your health care provider  about: ? Changing or stopping your regular medicines. This is especially important if you are taking diabetes medicines or blood thinners. ? Taking medicines such as aspirin and ibuprofen. These medicines can thin your blood. Do not take these medicines before your procedure if your health care provider instructs you not to.  Wear a good support bra to the procedure.  Do not put on antiperspirant or deodorant the day of the procedure, because it may cause white spots on the X-ray images.  You may be screened for extra fluid around the lymph nodes (lymphedema).  You will be asked to remove jewelry, dentures, eyeglasses, metal objects, or clothing that might interfere with the X-ray images. What happens during the procedure?  You will lie face-down on a table. Your breast will pass through an opening in the table.  Your breast will be gently compressed into an unchanging (fixed) position between a breast platform and a compression plate. Try to stay as relaxed as possible during the procedure. You will need to stay in one position for the length of the procedure.  X-rays will be used to locate the breast lump.  Your skin will be washed with soap, and you will be given a medicine to numb the area (local anesthetic).  A small incision will be made in your breast.  The tip of the biopsy needle will be directed through the incision. Several small pieces of suspicious tissue will be collected.  Then, a final set of X-ray images will be taken. If they show that the suspicious tissue has been mostly or completely removed, a small clip will be left at the biopsy site.  This will allow the biopsy site to be easily located if the results of the biopsy show that the tissue is cancerous.  The incision will be stitched (sutured) or taped and covered with a bandage (dressing). Your health care provider may apply a bandage that is wrapped tightly around your chest (pressure dressing) and an ice pack to  prevent bleeding and swelling in the breast. The procedure may vary among health care providers and hospitals. What happens after the procedure?  If you are doing well and have no problems, you will be allowed to go home.  It is up to you to get the results of your procedure. Ask your health care provider, or the department that is doing the procedure, when your results will be ready. This information is not intended to replace advice given to you by your health care provider. Make sure you discuss any questions you have with your health care provider. Document Released: 09/07/2003 Document Revised: 08/22/2016 Document Reviewed: 08/22/2016 Elsevier Interactive Patient Education  [employer].   Patient has been scheduled for a left breast stereotactic biopsy at [facility] for 07/22/18 at 1:00 pm to be done by Radiology. She will check-in at the [facility] at 12:45 pm. She will follow up here on 07/28/18 at 8:30 am. This patient is aware of date, time, and instructions. Patient verbalizes understanding.

## 2018-07-16 NOTE — Progress Notes (Signed)
[name], female   DOB: 05-07-1951, 67 y.o.   MRN: [id number]  Chief Complaint  Patient presents with  . Breast Problem    HPI Aimee Hill is a 67 y.o. female.  who presents for a breast evaluation. The most recent left mammogram and ultrasound was done on 07-07-18.  Patient does perform regular self breast checks and gets regular mammograms done.   She could not feel anything different in the breast. She is here today with her daughter, Aimee Hill. 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  The patient reported that she felt very pressured during her recent mammogram to make a decision about biopsy.  HPI  Past Medical History:  Diagnosis Date  . Fracture 2013   left foot and second and third toes on left foot  . Headache   . Hyperlipidemia   . Hypertension   . Hypothyroidism   . Osteoarthritis   . Urinary calculus     Past Surgical History:  Procedure Laterality Date  . COLONOSCOPY  2013   negative/Dr. Kathyrn Sheriff [place]-next one due 2023  . DILATION AND CURETTAGE, DIAGNOSTIC / THERAPEUTIC    . 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 TOOTH EXTRACTION    . TOTAL ABDOMINAL HYSTERECTOMY  1983   Dr. Rayford Hill  . TUBAL LIGATION    . URETERAL STENT PLACEMENT  1997    Family History  Problem Relation Age of Onset  . Alzheimer's disease Mother        died at age 53 from internal bleeding  . Rheum arthritis Mother   . Brain cancer Father 90  . Lymphoma Paternal Aunt 21  . Colon cancer Neg Hx   . Breast cancer Neg Hx     Social History Social History   Tobacco Use  . Smoking status: Never Smoker  . Smokeless tobacco: Never Used  Substance Use Topics  . Alcohol use: No  . Drug use: No    Allergies  Allergen Reactions  . Penicillins Hives    INJECTION ONLY    Current Outpatient Medications  Medication Sig Dispense Refill  . furosemide (LASIX) 20 MG tablet TAKE ONE (1) TABLET EACH  DAY    . gabapentin (NEURONTIN) 100 MG capsule   4  . levothyroxine (SYNTHROID, LEVOTHROID) 50 MCG tablet   10  . meloxicam (MOBIC) 15 MG tablet Take 15 mg by mouth daily.    . metaxalone (SKELAXIN) 800 MG tablet TAKE ONE TABLET BY MOUTH THREE TIMES A DAY AS NEEDED FOR PAIN    . omeprazole (PRILOSEC) 20 MG capsule Take 20 mg by mouth daily.    . pravastatin (PRAVACHOL) 10 MG tablet   10  . verapamil (CALAN-SR) 120 MG CR tablet Take 120 mg by mouth at bedtime.    Marland Kitchen VIVELLE-DOT 0.0375 MG/24HR APPLY ONE PATCH TRANSDERMAL ROUTE TWICE WEEKLY. 24 patch 0  . zolpidem (AMBIEN) 5 MG tablet Take 5 mg by mouth at bedtime.   5   No current facility-administered medications for this visit.     Review of Systems Review of Systems  Constitutional: Negative.   Respiratory: Negative.   Cardiovascular: Negative.     Blood pressure 128/74, pulse 89, resp. rate 14, height 5\' 4"  (1.626 m), weight 214 lb (97.1 kg).  Physical Exam Physical Exam  Constitutional: She is oriented to person, place, and time. She appears well-developed and well-nourished.  HENT:  Mouth/Throat: Oropharynx is clear and moist.  Eyes: Conjunctivae are normal. No scleral icterus.  Neck: Neck supple.  Cardiovascular: Normal rate, regular rhythm and normal heart sounds.  Pulmonary/Chest: Effort normal and breath sounds normal. Right breast exhibits no inverted nipple, no mass, no nipple discharge, no skin change and no tenderness. Left breast exhibits no inverted nipple, no mass, no nipple discharge, no skin change and no tenderness.  Left breast > right breast 10%.   Lymphadenopathy:    She has no cervical adenopathy.    She has no axillary adenopathy.  Neurological: She is alert and oriented to person, place, and time.  Skin: Skin is warm and dry.  Psychiatric: Her behavior is normal.    Data Reviewed July 07, 2018 left breast mammogram and ultrasound reviewed.  Significant change in microcalcifications compared to 2013  study (limited due to density).  Right breast unremarkable.  BI-RADS-4.  Assessment    New microcalcifications involving the left breast, no history of trauma.    Plan    The patient was encouraged to proceed with a stereotactic biopsy.  Aimee Olp, MD was the radiologist who interacted with the patient during her recent diagnostic study.  She does not want to have that physician complete her biopsy and arrangements be made to meet her wishes. The patient will be scheduled for a stereotactic left breast biopsy.  The stereotactic procedure was reviewed with the patient. The potential for bleeding, infection and pain was reviewed. At this time, the benefits outweigh the risk, and the patient is amenable to proceed.     HPI, Physical Exam, Assessment and Plan have been scribed under the direction and in the presence of Aimee Bellow, MD. Aimee Fetch, RN  I have completed the exam and reviewed the above documentation for accuracy and completeness.  I agree with the above.  Aimee Hill has been used and any errors in dictation or transcription are unintentional.  Aimee Hill, M.D., F.A.C.S. Patient has been scheduled for a left breast stereotactic biopsy at [facility] for 07/22/18 at 1:00 pm to be done by Radiology. She will check-in at the [facility] at 12:45 pm. She will follow up here on 07/28/18 at 8:30 am. This patient is aware of date, time, and instructions. Patient verbalizes understanding. Documented by Aimee Hill   Aimee Hill 07/17/2018, 9:15 PM

## 2018-07-17 DIAGNOSIS — R920 Mammographic microcalcification found on diagnostic imaging of breast: Secondary | ICD-10-CM | POA: Insufficient documentation

## 2018-07-22 ENCOUNTER — Ambulatory Visit
Admission: RE | Admit: 2018-07-22 | Discharge: 2018-07-22 | Disposition: A | Discharge status: Home / Self Care | Payer: BLUE CROSS/BLUE SHIELD | Source: Ambulatory Visit | Attending: General Surgery | Admitting: General Surgery

## 2018-07-22 DIAGNOSIS — R920 Mammographic microcalcification found on diagnostic imaging of breast: Secondary | ICD-10-CM | POA: Diagnosis not present

## 2018-07-22 HISTORY — PX: BREAST BIOPSY: SHX20

## 2018-07-22 IMAGING — MG MM BREAST LOCALIZATION CLIP
4 series · 4 of 12 positions shown · non-contrast
Comparison: Previous exam(s).

CLINICAL DATA: Evaluate biopsy marker

EXAM:
DIAGNOSTIC LEFT MAMMOGRAM POST STEREOTACTIC BIOPSY

[L CC synth-2D]
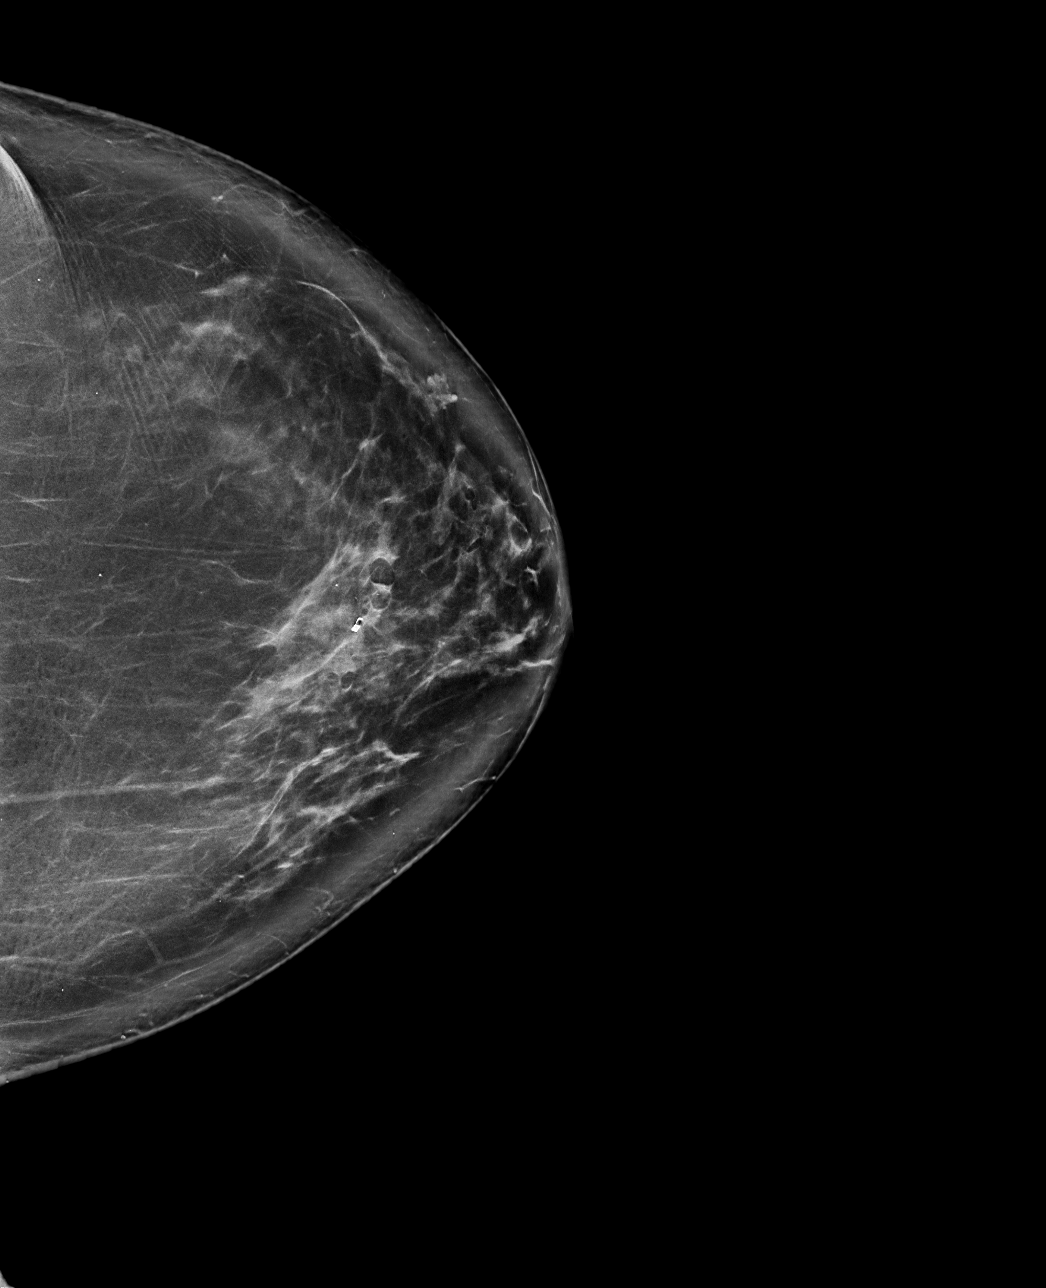

[L LM synth-2D]
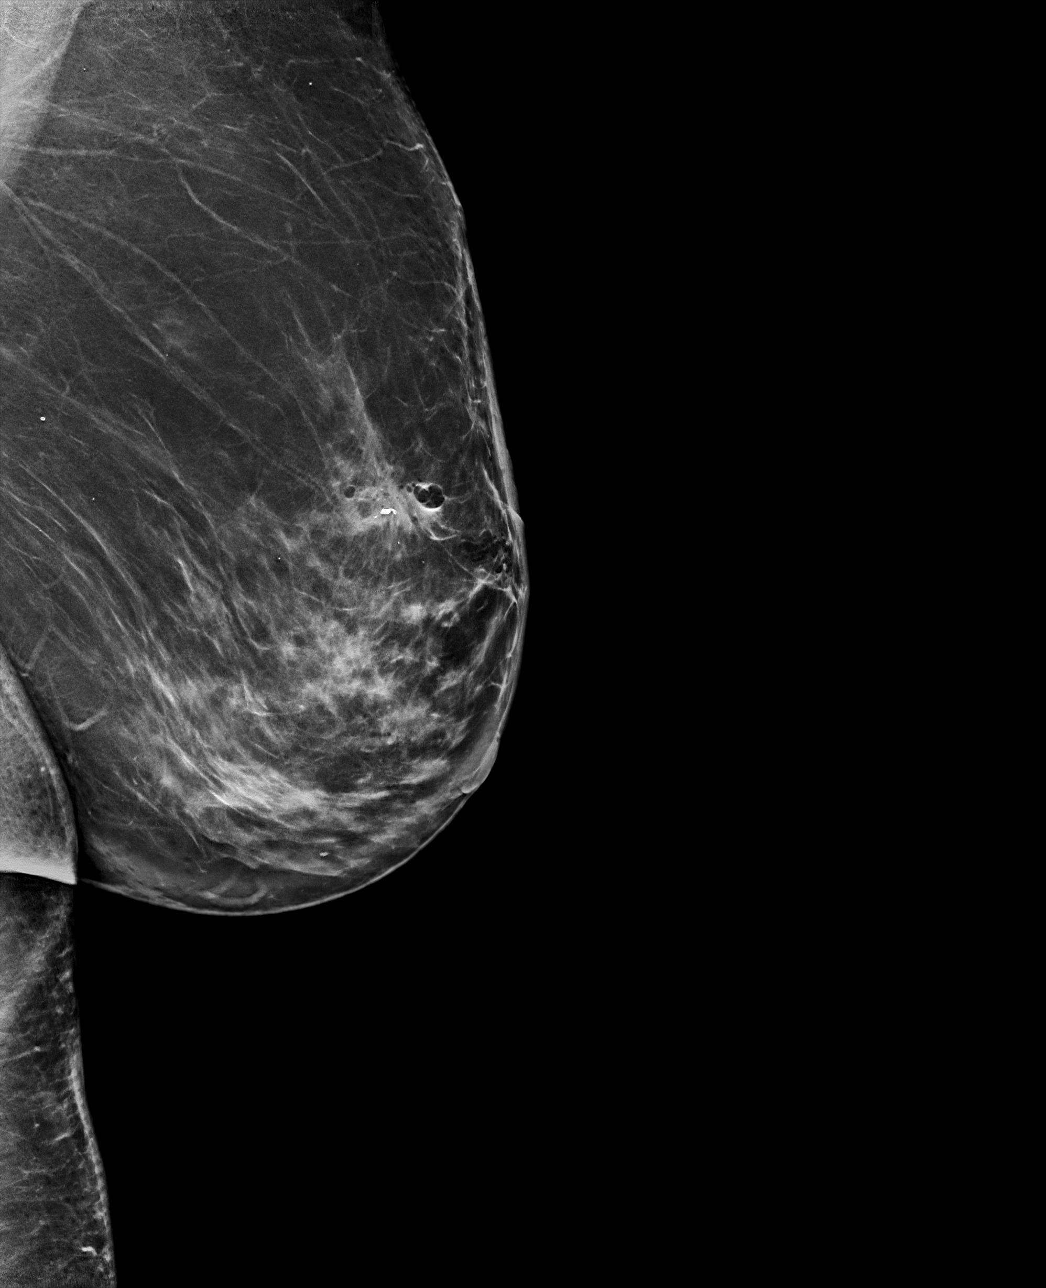

[L LM tomo · tomo slice 47/94.0]
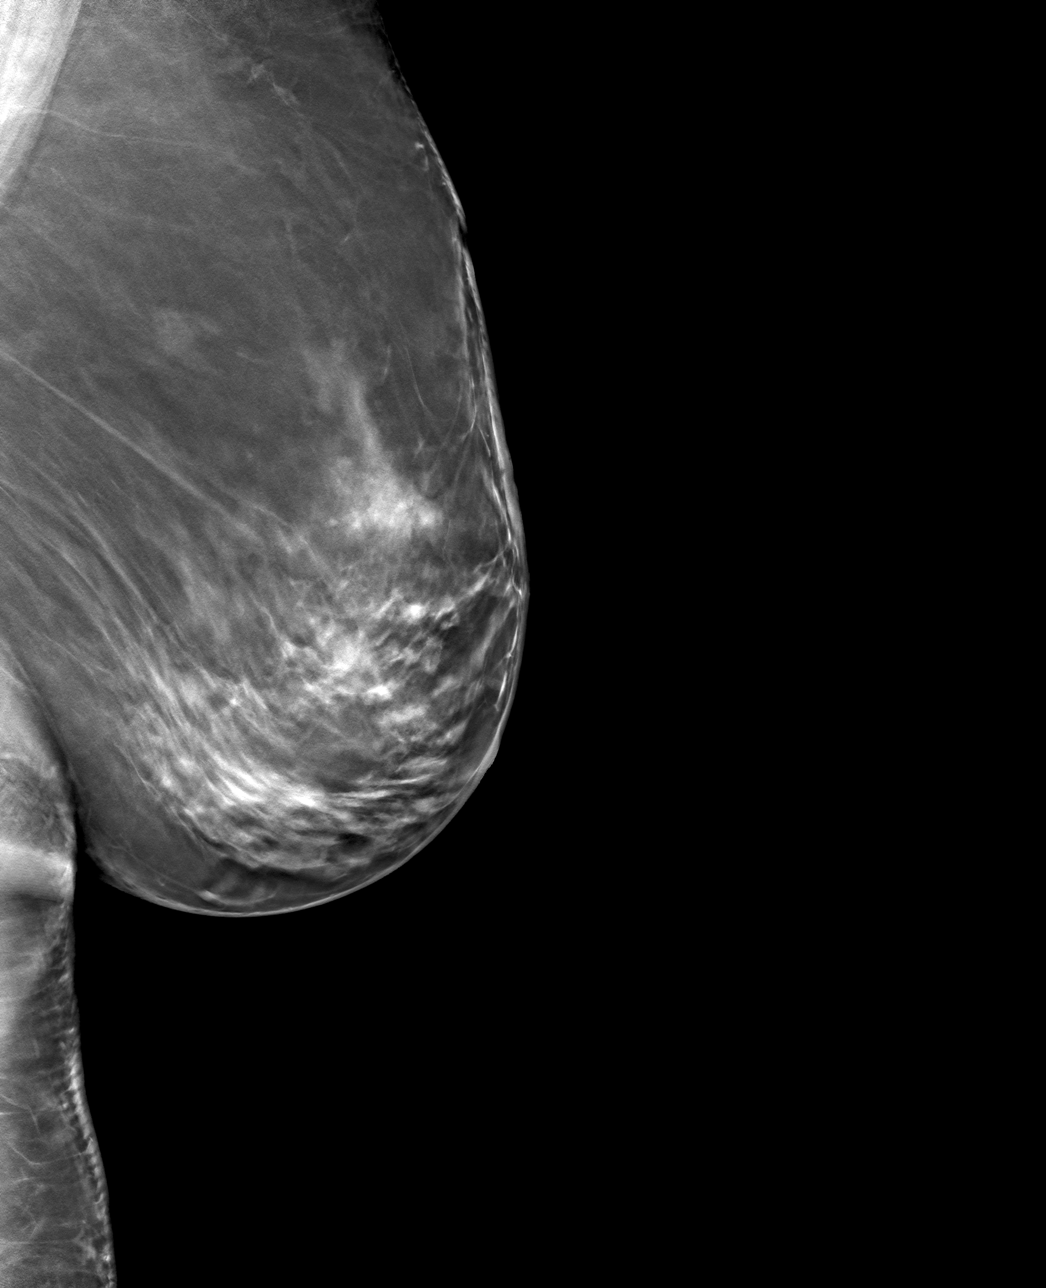

[L CC tomo · tomo slice 50/99.0]
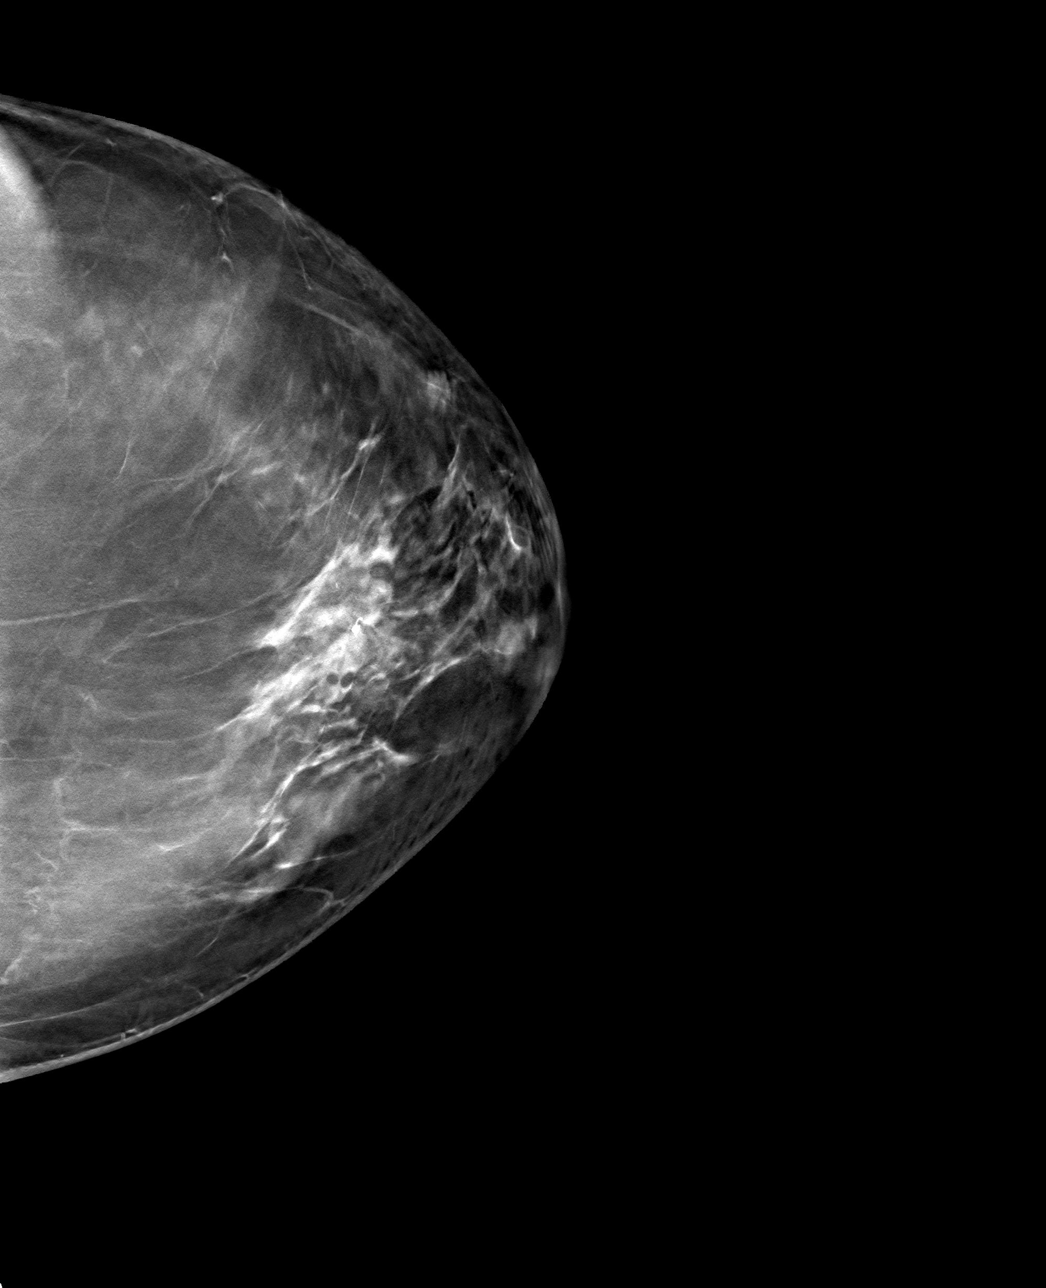

[4 of 12 positions shown; findings below may reference images not displayed]

FINDINGS: Mammographic images were obtained following stereotactic guided
biopsy of left breast distortion and calcifications.. The coil
shaped clip is within the site of distortion.
IMPRESSION: Appropriate clip placement as above.

Final Assessment: Post Procedure Mammograms for Marker Placement

## 2018-07-22 IMAGING — MG MM BREAST BX W LOC DEV 1ST LESION IMAGE BX SPEC STEREO GUIDE*L*
8 of 11 series · 8 of 27 positions shown · non-contrast
Comparison: Previous exams.

ADDENDUM:
Pathology of the left breast biopsy revealed A. BREAST
CALCIFICATIONS, LEFT UPPER OUTER; STEREOTACTIC BIOPSY: COMPLEX
SCLEROSING LESION WITH ATYPICAL LOBULAR HYPERPLASIA. DUCT ECTASIA.
CALCIFICATIONS ASSOCIATED WITH SCLEROSING LESION.

This was found to be concordant with Dr. JAKUB impression and
notes.
Recommendation: Follow up with Dr. JAKUB. Recommend surgical
excision.
The patient had a follow-up appointment with Dr. JAKUB on [DATE]
to discuss biopsy results.
Addendum by JAKUB on [DATE].
CLINICAL DATA: Biopsy of distortion and left breast calcifications
EXAM:
LEFT BREAST STEREOTACTIC CORE NEEDLE BIOPSY

[L (1 of 6)]
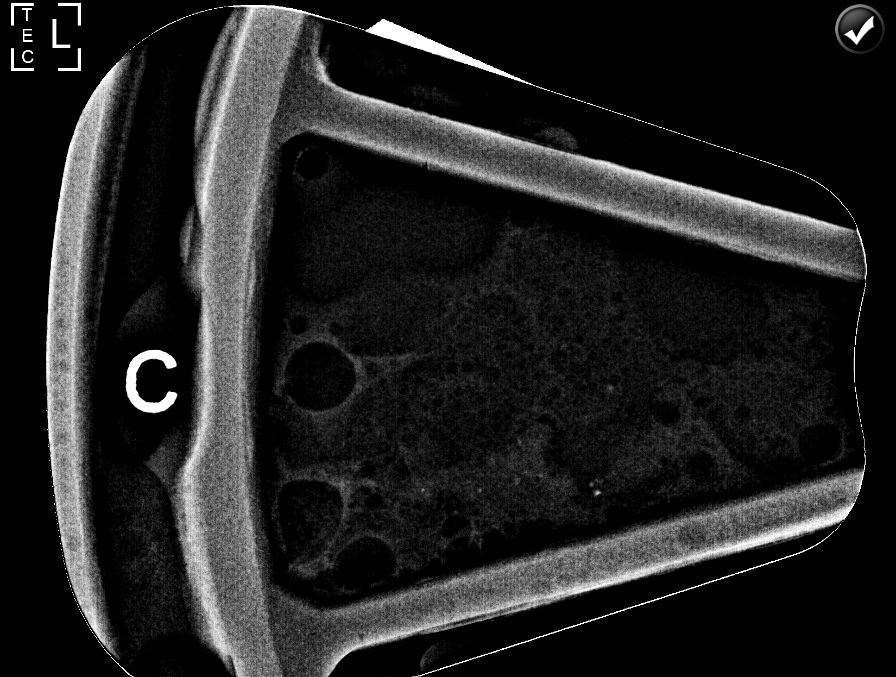

[L (2 of 6)]
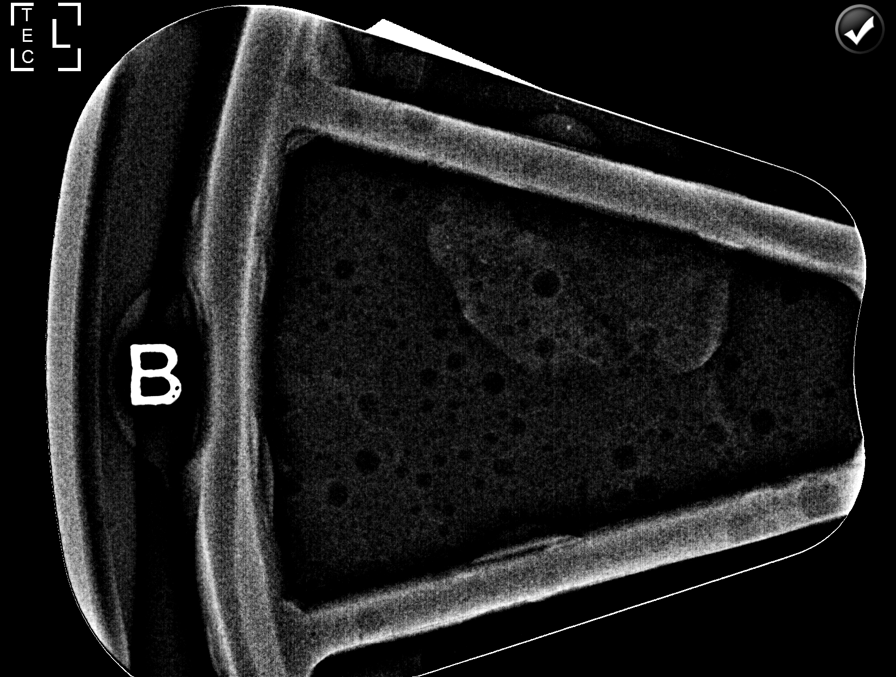

[L (3 of 6)]
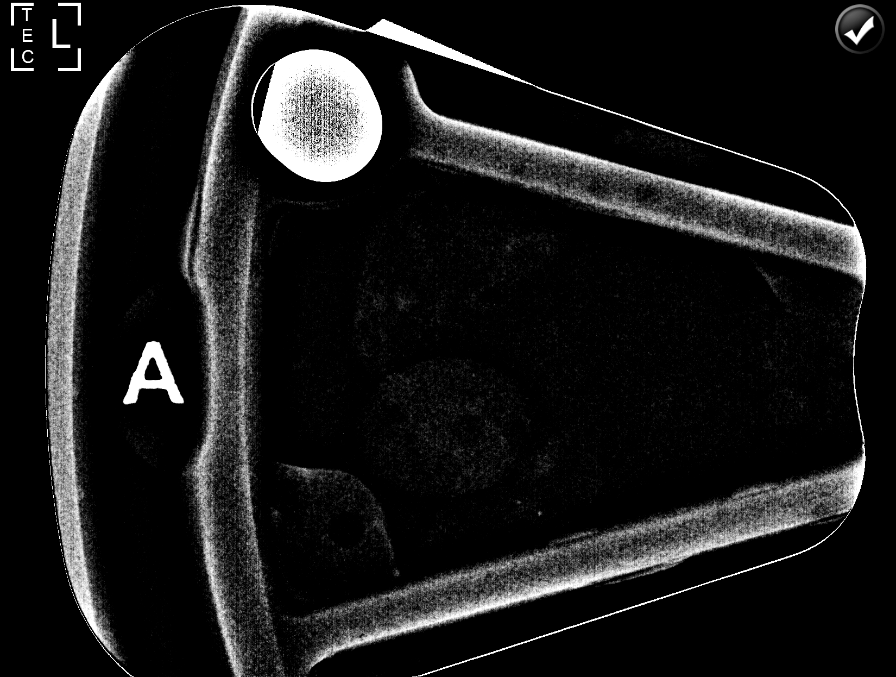

[L (4 of 6)]
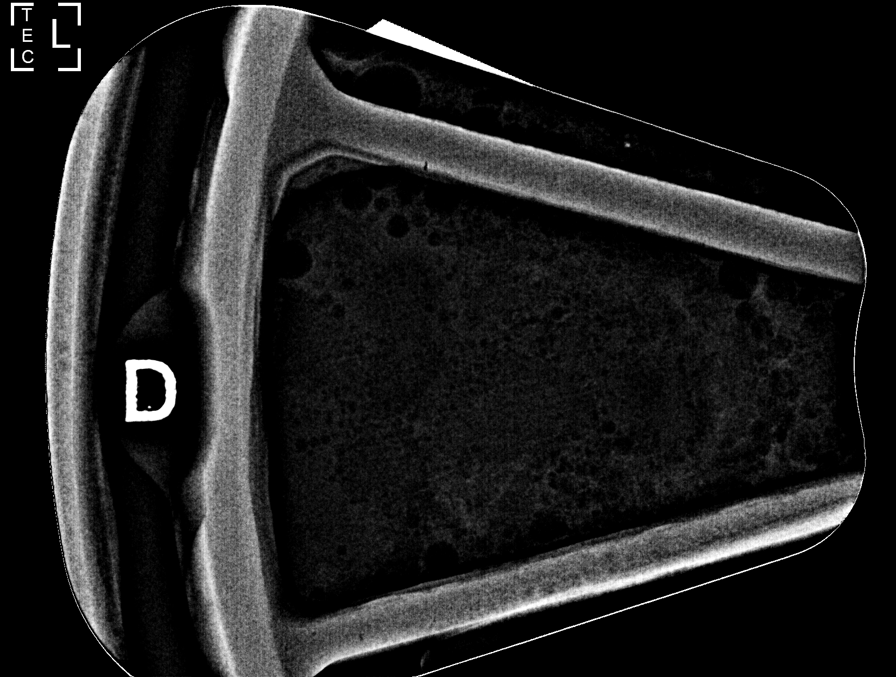

[L (5 of 6)]
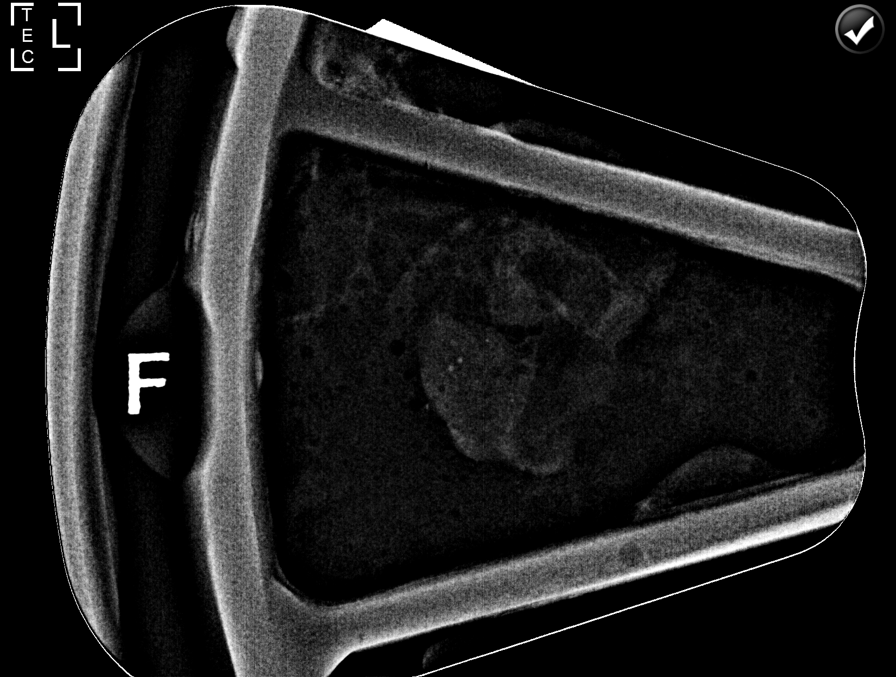

[L (6 of 6)]
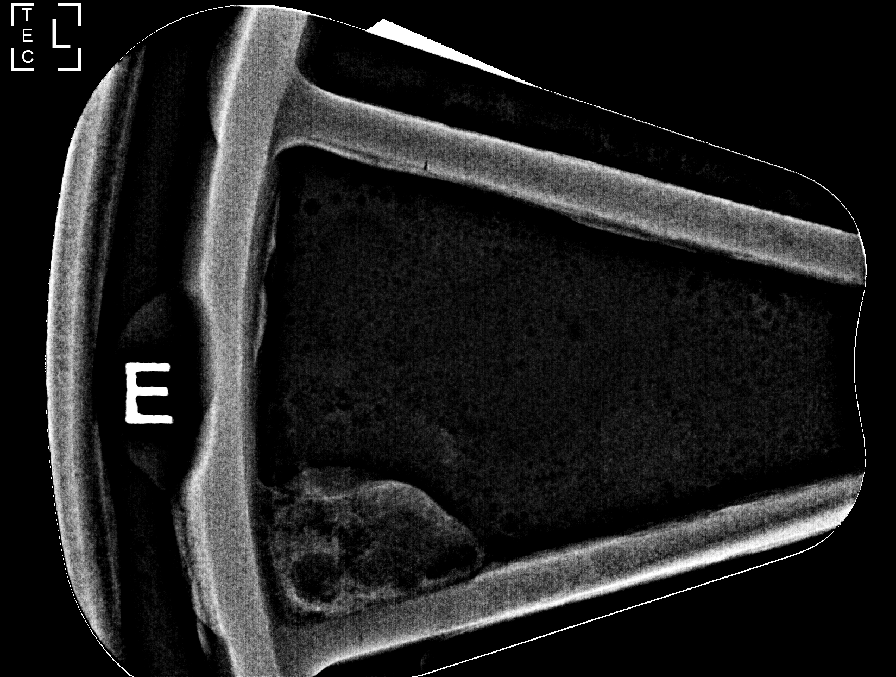

[L LM]
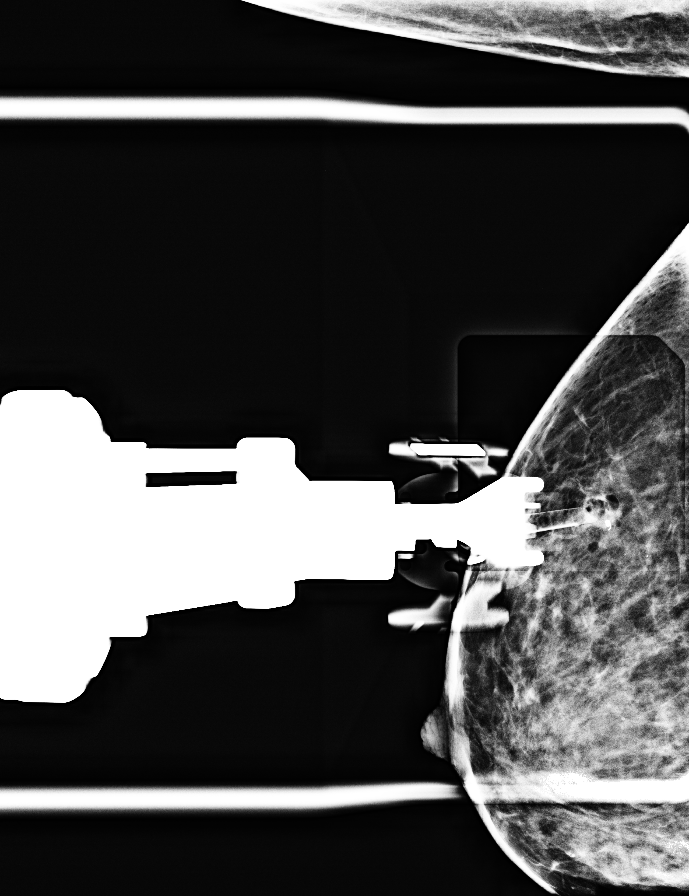

[L LM tomo · tomo slice 32/63.0]
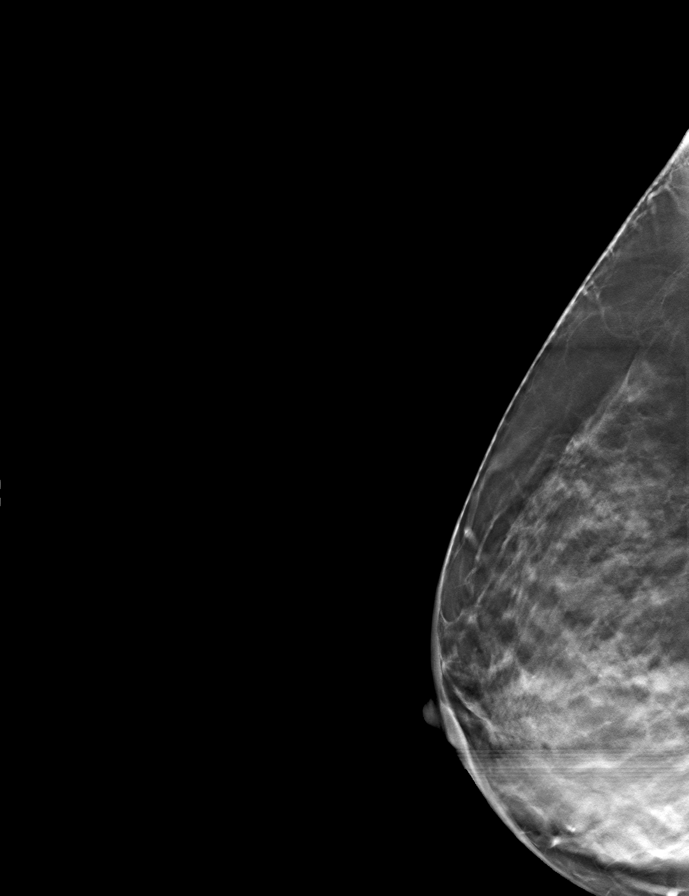

[8 of 27 positions shown; findings below may reference images not displayed]



Using sterile technique and 1% Lidocaine as local anesthetic, under
stereotactic guidance, a 9 gauge vacuum assisted device was used to
perform core needle biopsy of calcifications in the upper-outer left
breast using a lateral approach. Specimen radiograph was performed
showing calcifications in several specimens. Specimens with
calcifications are identified for pathology.

Lesion quadrant: Upper-outer

At the conclusion of the procedure, a tissue marker clip was
deployed into the biopsy cavity. Follow-up 2-view mammogram was
performed and dictated separately.
IMPRESSION: Stereotactic-guided biopsy of calcifications and distortion. No
apparent complications.

## 2018-07-24 ENCOUNTER — Telehealth: Payer: Self-pay | Admitting: General Surgery

## 2018-07-24 LAB — SURGICAL PATHOLOGY

## 2018-07-24 NOTE — Telephone Encounter (Signed)
The patient reports tolerating the stereotactic biopsy completed on July 22, 2018 well.  She had kind words for the staff.  Pathology shows a complex sclerosing lesion with atypical lobular hyperplasia.  This will best be addressed by formal excision.  Will sit down for an office discussion on Tuesday, August 6 2 reviewed indications for surgical excision.

## 2018-07-28 ENCOUNTER — Ambulatory Visit: Payer: Self-pay

## 2018-07-28 ENCOUNTER — Ambulatory Visit (INDEPENDENT_AMBULATORY_CARE_PROVIDER_SITE_OTHER): Payer: BLUE CROSS/BLUE SHIELD | Admitting: General Surgery

## 2018-07-28 ENCOUNTER — Encounter: Payer: Self-pay | Admitting: Certified Nurse Midwife

## 2018-07-28 ENCOUNTER — Ambulatory Visit (INDEPENDENT_AMBULATORY_CARE_PROVIDER_SITE_OTHER): Payer: BLUE CROSS/BLUE SHIELD | Admitting: Certified Nurse Midwife

## 2018-07-28 ENCOUNTER — Encounter: Payer: Self-pay | Admitting: General Surgery

## 2018-07-28 VITALS — BP 118/70 | HR 76 | Ht 65.0 in | Wt 215.0 lb

## 2018-07-28 VITALS — BP 132/80 | HR 72 | Resp 13 | Ht 65.0 in | Wt 216.0 lb

## 2018-07-28 DIAGNOSIS — R897 Abnormal histological findings in specimens from other organs, systems and tissues: Secondary | ICD-10-CM

## 2018-07-28 DIAGNOSIS — R920 Mammographic microcalcification found on diagnostic imaging of breast: Secondary | ICD-10-CM

## 2018-07-28 DIAGNOSIS — N6092 Unspecified benign mammary dysplasia of left breast: Secondary | ICD-10-CM | POA: Diagnosis not present

## 2018-07-28 DIAGNOSIS — Z01419 Encounter for gynecological examination (general) (routine) without abnormal findings: Secondary | ICD-10-CM

## 2018-07-28 NOTE — Progress Notes (Signed)
[name], female   DOB: [birth year], 67 y.o.   MRN: [id number]  Chief Complaint  Patient presents with  . Other    HPI Aimee Hill is a 67 y.o. female here today for her follow up left breast stereo biopsy done on 07/22/2018. Patient states she is doing well.   Past Medical History:  Diagnosis Date  . Fracture 2013   left foot and second and third toes on left foot  . Headache   . Hyperlipidemia   . Hypertension   . Hypothyroidism   . Osteoarthritis   . Urinary calculus     Past Surgical History:  Procedure Laterality Date  . BREAST BIOPSY Left 07/22/2018   affirm bx coil marker path pending  . COLONOSCOPY  2013   negative/Dr. Kathyrn Sheriff [place]-next one due 2023  . DILATION AND CURETTAGE, DIAGNOSTIC / THERAPEUTIC    . 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 TOOTH EXTRACTION    . TOTAL ABDOMINAL HYSTERECTOMY  1983   Dr. Rayford Halsted  . TUBAL LIGATION    . URETERAL STENT PLACEMENT  1997    Family History  Problem Relation Age of Onset  . Alzheimer's disease Mother        died at age 70 from internal bleeding  . Rheum arthritis Mother   . Brain cancer Father 62  . Lymphoma Paternal Aunt 60  . Colon cancer Paternal Aunt   . Breast cancer Neg Hx     Social History Social History   Tobacco Use  . Smoking status: Never Smoker  . Smokeless tobacco: Never Used  Substance Use Topics  . Alcohol use: No  . Drug use: No    Allergies  Allergen Reactions  . Penicillins Hives    INJECTION ONLY    Current Outpatient Medications  Medication Sig Dispense Refill  . furosemide (LASIX) 20 MG tablet TAKE ONE (1) TABLET EACH DAY    . gabapentin (NEURONTIN) 100 MG capsule   4  . levothyroxine (SYNTHROID, LEVOTHROID) 50 MCG tablet   10  . meloxicam (MOBIC) 15 MG tablet Take 15 mg by mouth daily.    . metaxalone (SKELAXIN) 800 MG tablet TAKE ONE TABLET BY MOUTH THREE TIMES A DAY AS NEEDED FOR PAIN    . omeprazole  (PRILOSEC) 20 MG capsule Take 20 mg by mouth daily.    . pravastatin (PRAVACHOL) 10 MG tablet   10  . zolpidem (AMBIEN) 5 MG tablet Take 5 mg by mouth at bedtime.   5  . bumetanide (BUMEX) 1 MG tablet Take 1 mg by mouth daily.    . nabumetone (RELAFEN) 500 MG tablet Take 500 mg by mouth 2 (two) times daily.      No current facility-administered medications for this visit.     Review of Systems Review of Systems  Constitutional: Negative.   Respiratory: Negative.   Cardiovascular: Negative.     Blood pressure 132/80, pulse 72, resp. rate 13, height 5\' 5"  (1.651 m), weight 216 lb (98 kg).  Physical Exam Physical Exam  Constitutional: She is oriented to person, place, and time. She appears well-developed and well-nourished.  Cardiovascular: Normal rate, regular rhythm and normal heart sounds.  Pulmonary/Chest: Effort normal and breath sounds normal.    Neurological: She is alert and oriented to person, place, and time.  Skin: Skin is warm and dry.    Data Reviewed July 22, 2018 stereotactic biopsy:  DIAGNOSIS:  A. BREAST CALCIFICATIONS, LEFT UPPER OUTER;  STEREOTACTIC BIOPSY:  - COMPLEX SCLEROSING LESION WITH ATYPICAL LOBULAR HYPERPLASIA.  - DUCT ECTASIA.  - CALCIFICATIONS ASSOCIATED WITH SCLEROSING LESION.   Comment:  A p63 stain was performed and supports the above diagnosis.   Ultrasound examination of the left breast was undertaken to determine if preoperative wire localization would be required prior to wide excision of this area.  In the 12 o'clock position, 6 cm from the nipple a biopsy cavity measuring 0.75 x 0.87 x 1.06 cm is noted.  Biopsy clip is evident.   Assessment    Atypical lobular hyperplasia of the breast in association with a complex sclerosing lesion/radial scar.    Plan Indications for wide excision to confirm no upstaging to lobular carcinoma in situ or invasive cancer was reviewed.  Patient to have a left breast excision at [facility]. The patient is  aware to call back for any questions or concerns.  HPI, Physical Exam, Assessment and Plan have been scribed under the direction and in the presence of Hervey Ard, MD. Gaspar Cola, CMA  I have completed the exam and reviewed the above documentation for accuracy and completeness.  I agree with the above.  Haematologist has been used and any errors in dictation or transcription are unintentional.  Hervey Ard, M.D., F.A.C.S.  The patient is scheduled for surgery at [facility] on 08/06/18. She will pre admit by phone. The patient is aware of date and instructions.  Documented by Caryl-Lyn Otis Brace LPN  Aimee Hill Aimee Hill 07/28/2018, 9:27 PM

## 2018-07-28 NOTE — Patient Instructions (Addendum)
Patient to have a left breast excision at [facility]. The patient is aware to call back for any questions or concerns.  The patient is scheduled for surgery at [facility] on 08/06/18. She will pre admit by phone. The patient is aware of date and instructions.

## 2018-07-28 NOTE — Progress Notes (Signed)
Gynecology Annual Exam  PCP: [facility] Chief Complaint:  Chief Complaint  Patient presents with  . Gynecologic Exam    History of Present Illness: Aimee Hill presents today for her annual exam. She is a 67 year old White female , G 1 P 1 0 0 1 , whose LMP was in 1983 due to  a TAH and RSO for an ovarian mass .  She stopped her Vivelle Dot patches after having a stereotactic biopsy of her left breast 07/22/2018. The biopsy came back as a sclerosing complex lesion and atypical lobular hyperplasia. Dr Bary Castilla has her scheduled for a excision of the breast mass later this month.Has had moe hot flashes since discontinuing her Vivelle patches. She has had no spotting.  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  Since her last annual GYN exam dated 05/14/2017, she also was diagnosed with low vitamin D3. Is no longer taking vitamin D3 supplements. Her last mammogram 06/30/2018 was Birads 0 for a possible distortion in the left breast. Additional views were done and there was a persistent distortion with associated microcalcafications in the left breast and was Birads 4. The stereotactic was then performed on 07/22/2018. There is no family history of breast or ovarian cancer. She does do monthly self breast exams She does not have Pap smears since her TAH. She had a DEXA scan done in 2013 that was normal. Her next is due at age 28. The patient does not smoke or drink alcohol. She does not exercise, works 12 hour days. She may get adequate calcium in her diet. Her current BMI=35.78 kg/m2   The patient denies current symptoms of depression.    Review of Systems: Review of Systems  Constitutional: Positive for malaise/fatigue. Negative for chills, fever and weight loss.  HENT: Negative for congestion, sinus pain  and sore throat.   Eyes: Negative for blurred vision and pain.  Respiratory: Negative for hemoptysis, shortness of breath and wheezing.   Cardiovascular: Negative for chest pain, palpitations and leg swelling.  Gastrointestinal: Negative for abdominal pain, blood in stool, diarrhea, heartburn, nausea and vomiting.  Genitourinary: Negative for dysuria, frequency, hematuria and urgency.       Occasional incontinence  Musculoskeletal: Positive for joint pain (and swelling) and neck pain. Negative for back pain and myalgias.  Skin: Negative for itching and rash.  Neurological: Positive for headaches. Negative for dizziness and tingling.  Endo/Heme/Allergies: Negative for environmental allergies and polydipsia. Does not bruise/bleed easily.       Negative for hirsutism; positive for hot flashes   Psychiatric/Behavioral: Negative for depression. The patient is not nervous/anxious and does not have insomnia.     Past Medical History:  Past Medical History:  Diagnosis Date  . Fracture 2013   left foot and second and third toes on left foot  . Headache   . Hyperlipidemia   . Hypertension   . Hypothyroidism   . Osteoarthritis   . Urinary calculus     Past Surgical History:  Past Surgical History:  Procedure Laterality Date  . BREAST BIOPSY Left 07/22/2018   affirm bx coil marker path pending  . COLONOSCOPY  2013   negative/Dr. Kathyrn Sheriff [place]-next one due 2023  . DILATION AND CURETTAGE, DIAGNOSTIC / THERAPEUTIC    . 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 TOOTH EXTRACTION    .  TOTAL ABDOMINAL HYSTERECTOMY  1983   Dr. Rayford Halsted  . TUBAL LIGATION    . URETERAL STENT PLACEMENT  1997    Family History:  Family History  Problem Relation Age of Onset  . Alzheimer's disease Mother        died at age 41 from internal bleeding  . Rheum arthritis Mother   . Brain cancer Father 60  . Lymphoma Paternal Aunt 30  . Colon cancer Neg Hx   . Breast cancer  Neg Hx     Social History:  Social History   Socioeconomic History  . Marital status: Married    Spouse name: Not on file  . Number of children: 1  . Years of education: 77  . Highest education level: Not on file  Occupational History  . Occupation: Network engineer  Social Needs  . Financial resource strain: Not on file  . Food insecurity:    Worry: Not on file    Inability: Not on file  . Transportation needs:    Medical: Not on file    Non-medical: Not on file  Tobacco Use  . Smoking status: Never Smoker  . Smokeless tobacco: Never Used  Substance and Sexual Activity  . Alcohol use: No  . Drug use: No  . Sexual activity: Yes    Partners: Male    Birth control/protection: Surgical  Lifestyle  . Physical activity:    Days per week: Not on file    Minutes per session: Not on file  . Stress: Not on file  Relationships  . Social connections:    Talks on phone: Not on file    Gets together: Not on file    Attends religious service: Not on file    Active member of club or organization: Not on file    Attends meetings of clubs or organizations: Not on file    Relationship status: Not on file  . Intimate partner violence:    Fear of current or ex partner: Not on file    Emotionally abused: Not on file    Physically abused: Not on file    Forced sexual activity: Not on file  Other Topics Concern  . Not on file  Social History Narrative  . Not on file    Allergies:  Allergies  Allergen Reactions  . Penicillins Hives    INJECTION ONLY    Medications: Current Outpatient Medications on File Prior to Visit  Medication Sig Dispense Refill  . bumetanide (BUMEX) 1 MG tablet Take 1 mg by mouth daily.    . furosemide (LASIX) 20 MG tablet TAKE ONE (1) TABLET EACH DAY    . gabapentin (NEURONTIN) 100 MG capsule   4  . levothyroxine (SYNTHROID, LEVOTHROID) 50 MCG tablet   10  . meloxicam (MOBIC) 15 MG tablet Take 15 mg by mouth daily.    . metaxalone (SKELAXIN) 800 MG tablet  TAKE ONE TABLET BY MOUTH THREE TIMES A DAY AS NEEDED FOR PAIN    . nabumetone (RELAFEN) 500 MG tablet Take 500 mg by mouth 2 (two) times daily.     Marland Kitchen omeprazole (PRILOSEC) 20 MG capsule Take 20 mg by mouth daily.    . pravastatin (PRAVACHOL) 10 MG tablet   10  . zolpidem (AMBIEN) 5 MG tablet Take 5 mg by mouth at bedtime.   5   No current facility-administered medications on file prior to visit.         Physical Exam Vitals: BP 118/70   Pulse 76  Ht 5\' 5"  (1.651 m)   Wt 215 lb (97.5 kg)   BMI 35.78 kg/m   General: pleasant WF in NAD HEENT: normocephalic, anicteric Neck: no thyroid enlargement, no palpable nodules, no cervical lymphadenopathy  Pulmonary: No increased work of breathing, CTAB Cardiovascular: RRR, without murmur  Breast: Breast symmetrical, no tenderness, no palpable nodules or masses, no skin or nipple retraction present, no nipple discharge. There is mild bruising in the left breast from the recent biopsy. No axillary, infraclavicular or supraclavicular lymphadenopathy. Abdomen: Soft, non-tender, non-distended, obese.  Umbilicus without lesions.  No hepatomegaly or masses palpable. No evidence of hernia. Genitourinary:  External: Normal external female genitalia.  Normal urethral meatus, normal Bartholin's and Skene's glands.    Vagina: Normal vaginal mucosa, no evidence of prolapse.    Cervix: surgically absent  Uterus: surgically absent  Adnexa: No adnexal masses, non-tender  Rectal: deferred  Lymphatic: no evidence of inguinal lymphadenopathy Extremities: no edema, erythema, or tenderness Neurologic: Grossly intact Psychiatric: mood appropriate, affect full     Assessment: 67 y.o. G1P1001 well woman exam S/p left breast biopsy-complex sclerosing lesion and atypical lobular hyperplasia Plan:   1) Excision of breast mass scheduled for next week by Dr Bary Castilla. 2) Pap smear: not indicated 3)) Routine healthcare maintenance including cholesterol and  diabetes screening managed by PCP  4) Next colonoscopy due 2023 5) Next DEXA scan next year.  Osteoporosis prevention: Recommended taking vitamin D3 1000 IU daily. Also encouraged using some hand weights at home.  6) RTO in 1 year and prn  Dalia Heading, CNM

## 2018-07-28 NOTE — Patient Instructions (Signed)
Take 1000 IU vitamin D3 daily

## 2018-07-30 ENCOUNTER — Encounter
Admission: RE | Admit: 2018-07-30 | Discharge: 2018-07-30 | Disposition: A | Discharge status: Home / Self Care | Payer: BLUE CROSS/BLUE SHIELD | Source: Ambulatory Visit | Attending: General Surgery | Admitting: General Surgery

## 2018-07-30 ENCOUNTER — Other Ambulatory Visit: Payer: Self-pay

## 2018-07-30 HISTORY — DX: Gastro-esophageal reflux disease without esophagitis: K21.9

## 2018-07-30 HISTORY — DX: Family history of other specified conditions: Z84.89

## 2018-07-30 HISTORY — DX: Other complications of anesthesia, initial encounter: T88.59XA

## 2018-07-30 HISTORY — DX: Other specified postprocedural states: R11.2

## 2018-07-30 HISTORY — DX: Adverse effect of unspecified anesthetic, initial encounter: T41.45XA

## 2018-07-30 HISTORY — DX: Personal history of urinary calculi: Z87.442

## 2018-07-30 HISTORY — DX: Other specified postprocedural states: Z98.890

## 2018-07-31 NOTE — Patient Instructions (Signed)
Your procedure is scheduled on: 08-06-18 THURSDAY Report to Same Day Surgery 2nd floor medical mall [facility] Entrance-take elevator on left to 2nd floor.  Check in with surgery information desk.) To find out your arrival time please call [phone] between 1PM - 3PM on 08-05-18 [facility]  Remember: Instructions that are not followed completely may result in serious medical risk, up to and including death, or upon the discretion of your surgeon and anesthesiologist your surgery may need to be rescheduled.    _x___ 1. Do not eat food after midnight the night before your procedure. NO GUM OR CANDY AFTER MIDNIGHT.  You may drink clear liquids up to 2 hours before you are scheduled to arrive at the hospital for your procedure.  Do not drink clear liquids within 2 hours of your scheduled arrival to the hospital.  Clear liquids include  --Water or Apple juice without pulp  --Clear carbohydrate beverage such as ClearFast or Gatorade  --Black Coffee or Clear Tea (No milk, no creamers, do not add anything to the coffee or Tea   ____Ensure clear carbohydrate drink on the way to the hospital for bariatric patients  ____Ensure clear carbohydrate drink 3 hours before surgery for Dr Dwyane Luo patients if physician instructed.    __x__ 2. No Alcohol for 24 hours before or after surgery.   __x__3. No Smoking or e-cigarettes for 24 prior to surgery.  Do not use any chewable tobacco products for at least 6 hour prior to surgery   ____  4. Bring all medications with you on the day of surgery if instructed.    __x__ 5. Notify your doctor if there is any change in your medical condition     (cold, fever, infections).    x___6. On the morning of surgery brush your teeth with toothpaste and water.  You may rinse your mouth with mouth wash if you wish.  Do not swallow any toothpaste or mouthwash.   Do not wear jewelry, make-up, hairpins, clips or nail polish.  Do not wear lotions, powders, or perfumes.  You may wear deodorant.  Do not shave 48 hours prior to surgery. Men may shave face and neck.  Do not bring valuables to the hospital.    [facility] is not responsible for any belongings or valuables.               Contacts, dentures or bridgework may not be worn into surgery.  Leave your suitcase in the car. After surgery it may be brought to your room.  For patients admitted to the hospital, discharge time is determined by your treatment team.  _  Patients discharged the day of surgery will not be allowed to drive home.  You will need someone to drive you home and stay with you the night of your procedure.    Please read over the following fact sheets that you were given:   [facility] Preparing for Surgery   _x___ TAKE THE FOLLOWING MEDICATION THE MORNING OF SURGERY WITH A SMALL SIP OF WATER. These include:  1. GABAPENTIN (NEURONTIN)  2. LEVOTHYROXINE (SYNTHROID)  3. PRILOSEC (OMEPRAZOLE)  4. PRAVACHOL (PRAVASTATIN)  5. VERAPAMIL (CALAN-SR)  6.  ____Fleets enema or Magnesium Citrate as directed.   _x___ Use CHG Soap or sage wipes as directed on instruction sheet   ____ Use inhalers on the day of surgery and bring to hospital day of surgery  ____ Stop Metformin and Janumet 2 days prior to surgery.    ____ Take  1/2 of usual insulin dose the night before surgery and none on the morning surgery.   ____ Follow recommendations from Cardiologist, Pulmonologist or PCP regarding stopping Aspirin, Coumadin, Plavix ,Eliquis, Effient, or Pradaxa, and Pletal.  ____Stop Anti-inflammatories such as Advil, Aleve, Ibuprofen, Motrin, Naproxen, Naprosyn, Goodies powders or aspirin products. OK to take Tylenol    ____ Stop supplements until after surgery.     ____ Bring C-Pap to the hospital.

## 2018-08-03 ENCOUNTER — Encounter
Admission: RE | Admit: 2018-08-03 | Discharge: 2018-08-03 | Disposition: A | Discharge status: Home / Self Care | Payer: BLUE CROSS/BLUE SHIELD | Source: Ambulatory Visit | Attending: General Surgery | Admitting: General Surgery

## 2018-08-03 ENCOUNTER — Telehealth: Payer: Self-pay | Admitting: General Surgery

## 2018-08-03 DIAGNOSIS — Z01812 Encounter for preprocedural laboratory examination: Secondary | ICD-10-CM | POA: Insufficient documentation

## 2018-08-03 DIAGNOSIS — Z01810 Encounter for preprocedural cardiovascular examination: Secondary | ICD-10-CM | POA: Diagnosis present

## 2018-08-03 DIAGNOSIS — I10 Essential (primary) hypertension: Secondary | ICD-10-CM | POA: Diagnosis not present

## 2018-08-03 LAB — POTASSIUM: Potassium: 4.3 mmol/L (ref 3.5–5.1)

## 2018-08-03 NOTE — Telephone Encounter (Signed)
Heather called from Pre-Admit and stated the patient's EKG came back abnormal & she will be sending a request for medical clearance to [place] & patient's PCP. Please double check on this.

## 2018-08-03 NOTE — Pre-Procedure Instructions (Signed)
CALLED DR Kayleen Memos REGARDING ABNORMAL EKG-NO PREVIOUS EKG'S FOR COMPARISON-MEDICAL CLEARANCE REQUESTED BY DR CARROLL-CALLED AND SPOKE WITH MELANIE IN DR BYRNETTS OFFICE REGARDING PT NEEDING CLEARANCE-SHE WILL RELAY MESSAGE TO CARYL-LYN FOR WHEN SHE COMES BACK IN TO WORK IN AM-FAXED CLEARANCE REQUEST TO BYRNETTS OFFICE WITH FAX CONFIRMATION RECEIVED-TRIED TO CALL DR MORALES OFFICE BUT ALL LINES WERE BUSY AND THE AUTOMATED LINE SAID TO CALL BACK LATER. FAXED TO DR Alba Destine OFFICE WITH FAX CONFIRMATION RECIEVED

## 2018-08-04 NOTE — Pre-Procedure Instructions (Signed)
CALLED AND SPOKE WITH GALE AT [facility] REGARDING IF THEY RECEIVED THE CLEARANCE THAT I FAXED OVER LATE YESTERDAY AFTERNOON.  GALE SAID THAT IT WENT TO THE SOUTH OFFICE AND SHE GAVE ME THE NEW FAX #.  SHE SAID THE NURSE HAD ALREADY SPOKEN TO CARYL-LYN FROM DR BYRNETTS OFFICE ABOUT THIS.  I WENT AHEAD AND REFAXED THE CLEARANCE TO [facility]

## 2018-08-05 NOTE — Pre-Procedure Instructions (Signed)
MEDICAL CLEARANCE ON CHART FROM PCP-LOW RISK 

## 2018-08-06 ENCOUNTER — Ambulatory Visit
Admission: RE | Admit: 2018-08-06 | Discharge: 2018-08-06 | Disposition: A | Discharge status: Home / Self Care | Payer: BLUE CROSS/BLUE SHIELD | Source: Ambulatory Visit | Attending: General Surgery | Admitting: General Surgery

## 2018-08-06 ENCOUNTER — Ambulatory Visit: Payer: BLUE CROSS/BLUE SHIELD | Admitting: Certified Registered Nurse Anesthetist

## 2018-08-06 ENCOUNTER — Encounter: Payer: Self-pay | Admitting: Certified Registered Nurse Anesthetist

## 2018-08-06 ENCOUNTER — Encounter
Admission: RE | Disposition: A | Discharge status: Home / Self Care | Payer: Self-pay | Source: Ambulatory Visit | Attending: General Surgery

## 2018-08-06 ENCOUNTER — Other Ambulatory Visit: Payer: Self-pay

## 2018-08-06 DIAGNOSIS — Z800 Family history of malignant neoplasm of digestive organs: Secondary | ICD-10-CM | POA: Insufficient documentation

## 2018-08-06 DIAGNOSIS — Z820 Family history of epilepsy and other diseases of the nervous system: Secondary | ICD-10-CM | POA: Diagnosis not present

## 2018-08-06 DIAGNOSIS — Z87442 Personal history of urinary calculi: Secondary | ICD-10-CM | POA: Insufficient documentation

## 2018-08-06 DIAGNOSIS — D0502 Lobular carcinoma in situ of left breast: Secondary | ICD-10-CM | POA: Diagnosis not present

## 2018-08-06 DIAGNOSIS — D493 Neoplasm of unspecified behavior of breast: Secondary | ICD-10-CM | POA: Diagnosis not present

## 2018-08-06 DIAGNOSIS — E785 Hyperlipidemia, unspecified: Secondary | ICD-10-CM | POA: Diagnosis not present

## 2018-08-06 DIAGNOSIS — Z807 Family history of other malignant neoplasms of lymphoid, hematopoietic and related tissues: Secondary | ICD-10-CM | POA: Insufficient documentation

## 2018-08-06 DIAGNOSIS — N62 Hypertrophy of breast: Secondary | ICD-10-CM | POA: Diagnosis present

## 2018-08-06 DIAGNOSIS — Z808 Family history of malignant neoplasm of other organs or systems: Secondary | ICD-10-CM | POA: Insufficient documentation

## 2018-08-06 DIAGNOSIS — E039 Hypothyroidism, unspecified: Secondary | ICD-10-CM | POA: Diagnosis not present

## 2018-08-06 DIAGNOSIS — Z79899 Other long term (current) drug therapy: Secondary | ICD-10-CM | POA: Diagnosis not present

## 2018-08-06 DIAGNOSIS — M1990 Unspecified osteoarthritis, unspecified site: Secondary | ICD-10-CM | POA: Diagnosis not present

## 2018-08-06 DIAGNOSIS — Z880 Allergy status to penicillin: Secondary | ICD-10-CM | POA: Diagnosis not present

## 2018-08-06 DIAGNOSIS — I10 Essential (primary) hypertension: Secondary | ICD-10-CM | POA: Insufficient documentation

## 2018-08-06 DIAGNOSIS — Z8261 Family history of arthritis: Secondary | ICD-10-CM | POA: Insufficient documentation

## 2018-08-06 DIAGNOSIS — N6092 Unspecified benign mammary dysplasia of left breast: Secondary | ICD-10-CM

## 2018-08-06 DIAGNOSIS — Z90710 Acquired absence of both cervix and uterus: Secondary | ICD-10-CM | POA: Diagnosis not present

## 2018-08-06 DIAGNOSIS — R51 Headache: Secondary | ICD-10-CM | POA: Diagnosis not present

## 2018-08-06 HISTORY — PX: BREAST BIOPSY: SHX20

## 2018-08-06 HISTORY — PX: BREAST EXCISIONAL BIOPSY: SUR124

## 2018-08-06 SURGERY — BREAST BIOPSY
Anesthesia: General | Site: Breast | Laterality: Left | Wound class: Clean

## 2018-08-06 MED ORDER — DEXAMETHASONE SODIUM PHOSPHATE 10 MG/ML IJ SOLN
INTRAMUSCULAR | Status: DC | PRN
  Administered 2018-08-06: 10 mg via INTRAVENOUS

## 2018-08-06 MED ORDER — PROPOFOL 10 MG/ML IV BOLUS
INTRAVENOUS | Status: DC | PRN
  Administered 2018-08-06: 200 mg via INTRAVENOUS

## 2018-08-06 MED ORDER — ONDANSETRON HCL 4 MG/2ML IJ SOLN
INTRAMUSCULAR | Status: AC
  Filled 2018-08-06: qty 2

## 2018-08-06 MED ORDER — LIDOCAINE HCL (PF) 2 % IJ SOLN
INTRAMUSCULAR | Status: AC
Start: 2018-08-06 — End: ?
  Filled 2018-08-06: qty 10

## 2018-08-06 MED ORDER — PROPOFOL 10 MG/ML IV BOLUS
INTRAVENOUS | Status: AC
  Filled 2018-08-06: qty 20

## 2018-08-06 MED ORDER — FENTANYL CITRATE (PF) 100 MCG/2ML IJ SOLN
INTRAMUSCULAR | Status: DC | PRN
  Administered 2018-08-06 (×4): 25 ug via INTRAVENOUS

## 2018-08-06 MED ORDER — ACETAMINOPHEN NICU IV SYRINGE 10 MG/ML
INTRAVENOUS | Status: AC
  Filled 2018-08-06: qty 1

## 2018-08-06 MED ORDER — MEPERIDINE HCL 50 MG/ML IJ SOLN: 6.2500 mg | INTRAMUSCULAR | Status: DC | PRN

## 2018-08-06 MED ORDER — ONDANSETRON HCL 4 MG/2ML IJ SOLN
INTRAMUSCULAR | Status: DC | PRN
  Administered 2018-08-06: 4 mg via INTRAVENOUS

## 2018-08-06 MED ORDER — PROMETHAZINE HCL 25 MG/ML IJ SOLN: 6.2500 mg | INTRAMUSCULAR | Status: DC | PRN

## 2018-08-06 MED ORDER — BUPIVACAINE-EPINEPHRINE (PF) 0.5% -1:200000 IJ SOLN
INTRAMUSCULAR | Status: AC
  Filled 2018-08-06: qty 30

## 2018-08-06 MED ORDER — SCOPOLAMINE 1 MG/3DAYS TD PT72
1.0000 | MEDICATED_PATCH | TRANSDERMAL | Status: DC
  Administered 2018-08-06: 1.5 mg via TRANSDERMAL

## 2018-08-06 MED ORDER — LIDOCAINE HCL (CARDIAC) PF 100 MG/5ML IV SOSY
PREFILLED_SYRINGE | INTRAVENOUS | Status: DC | PRN
  Administered 2018-08-06: 100 mg via INTRAVENOUS

## 2018-08-06 MED ORDER — OXYCODONE HCL 5 MG/5ML PO SOLN: 5.0000 mg | Freq: Once | ORAL | Status: DC | PRN

## 2018-08-06 MED ORDER — SCOPOLAMINE 1 MG/3DAYS TD PT72
MEDICATED_PATCH | TRANSDERMAL | Status: AC
  Filled 2018-08-06: qty 1

## 2018-08-06 MED ORDER — HYDROCODONE-ACETAMINOPHEN 5-325 MG PO TABS: 1.0000 | ORAL_TABLET | ORAL | 0 refills | Status: DC | PRN

## 2018-08-06 MED ORDER — MIDAZOLAM HCL 2 MG/2ML IJ SOLN
INTRAMUSCULAR | Status: AC
  Filled 2018-08-06: qty 2

## 2018-08-06 MED ORDER — FENTANYL CITRATE (PF) 100 MCG/2ML IJ SOLN: 25.0000 ug | INTRAMUSCULAR | Status: DC | PRN

## 2018-08-06 MED ORDER — BUPIVACAINE-EPINEPHRINE (PF) 0.5% -1:200000 IJ SOLN
INTRAMUSCULAR | Status: DC | PRN
  Administered 2018-08-06: 30 mL

## 2018-08-06 MED ORDER — FENTANYL CITRATE (PF) 100 MCG/2ML IJ SOLN
INTRAMUSCULAR | Status: AC
  Filled 2018-08-06: qty 2

## 2018-08-06 MED ORDER — SCOPOLAMINE 1 MG/3DAYS TD PT72: 1.0000 | MEDICATED_PATCH | TRANSDERMAL | Status: DC

## 2018-08-06 MED ORDER — MIDAZOLAM HCL 2 MG/2ML IJ SOLN
INTRAMUSCULAR | Status: DC | PRN
  Administered 2018-08-06: 2 mg via INTRAVENOUS

## 2018-08-06 MED ORDER — ACETAMINOPHEN 10 MG/ML IV SOLN
INTRAVENOUS | Status: DC | PRN
  Administered 2018-08-06: 1000 mg via INTRAVENOUS

## 2018-08-06 MED ORDER — LACTATED RINGERS IV SOLN
INTRAVENOUS | Status: DC
  Administered 2018-08-06: 09:00:00 via INTRAVENOUS

## 2018-08-06 MED ORDER — OXYCODONE HCL 5 MG PO TABS: 5.0000 mg | ORAL_TABLET | Freq: Once | ORAL | Status: DC | PRN

## 2018-08-06 MED ORDER — DEXAMETHASONE SODIUM PHOSPHATE 10 MG/ML IJ SOLN
INTRAMUSCULAR | Status: AC
  Filled 2018-08-06: qty 1

## 2018-08-06 SURGICAL SUPPLY — 42 items
BINDER BREAST LRG (GAUZE/BANDAGES/DRESSINGS) IMPLANT
BINDER BREAST MEDIUM (GAUZE/BANDAGES/DRESSINGS) IMPLANT
BINDER BREAST XLRG (GAUZE/BANDAGES/DRESSINGS) IMPLANT
BINDER BREAST XXLRG (GAUZE/BANDAGES/DRESSINGS) IMPLANT
BLADE SURG 15 STRL SS SAFETY (BLADE) ×4 IMPLANT
CANISTER SUCT 1200ML W/VALVE (MISCELLANEOUS) ×2 IMPLANT
CHLORAPREP W/TINT 26ML (MISCELLANEOUS) ×2 IMPLANT
CNTNR SPEC 2.5X3XGRAD LEK (MISCELLANEOUS) ×1
CONT SPEC 4OZ STER OR WHT (MISCELLANEOUS) ×1
CONTAINER SPEC 2.5X3XGRAD LEK (MISCELLANEOUS) ×1 IMPLANT
COVER PROBE FLX POLY STRL (MISCELLANEOUS) ×2 IMPLANT
DEVICE DUBIN SPECIMEN MAMMOGRA (MISCELLANEOUS) ×2 IMPLANT
DRAPE LAPAROTOMY 100X77 ABD (DRAPES) ×2 IMPLANT
DRSG GAUZE FLUFF 36X18 (GAUZE/BANDAGES/DRESSINGS) ×2 IMPLANT
DRSG TELFA 4X3 1S NADH ST (GAUZE/BANDAGES/DRESSINGS) ×2 IMPLANT
ELECT CAUTERY BLADE TIP 2.5 (TIP) ×2
ELECT REM PT RETURN 9FT ADLT (ELECTROSURGICAL) ×2
ELECTRODE CAUTERY BLDE TIP 2.5 (TIP) ×1 IMPLANT
ELECTRODE REM PT RTRN 9FT ADLT (ELECTROSURGICAL) ×1 IMPLANT
GLOVE BIO SURGEON STRL SZ7.5 (GLOVE) ×2 IMPLANT
GLOVE INDICATOR 8.0 STRL GRN (GLOVE) ×2 IMPLANT
GOWN STRL REUS W/ TWL LRG LVL3 (GOWN DISPOSABLE) ×2 IMPLANT
GOWN STRL REUS W/TWL LRG LVL3 (GOWN DISPOSABLE) ×2
KIT TURNOVER KIT A (KITS) ×2 IMPLANT
LABEL OR SOLS (LABEL) ×2 IMPLANT
MARGIN MAP 10MM (MISCELLANEOUS) ×2 IMPLANT
NEEDLE HYPO 22GX1.5 SAFETY (NEEDLE) ×2 IMPLANT
NEEDLE HYPO 25X1 1.5 SAFETY (NEEDLE) ×2 IMPLANT
PACK BASIN MINOR ARMC (MISCELLANEOUS) ×2 IMPLANT
RETRACTOR RING XSMALL (MISCELLANEOUS) ×1 IMPLANT
RTRCTR WOUND ALEXIS 13CM XS SH (MISCELLANEOUS) ×2
SHEARS HARMONIC 9CM CVD (BLADE) ×2 IMPLANT
STRIP CLOSURE SKIN 1/2X4 (GAUZE/BANDAGES/DRESSINGS) ×2 IMPLANT
SUT ETHILON 3-0 FS-10 30 BLK (SUTURE) ×2
SUT VIC AB 2-0 CT1 27 (SUTURE) ×2
SUT VIC AB 2-0 CT1 TAPERPNT 27 (SUTURE) ×2 IMPLANT
SUT VIC AB 4-0 FS2 27 (SUTURE) ×2 IMPLANT
SUTURE EHLN 3-0 FS-10 30 BLK (SUTURE) ×1 IMPLANT
SWABSTK COMLB BENZOIN TINCTURE (MISCELLANEOUS) ×2 IMPLANT
SYR 10ML LL (SYRINGE) ×2 IMPLANT
TAPE TRANSPORE STRL 2 31045 (GAUZE/BANDAGES/DRESSINGS) ×2 IMPLANT
WATER STERILE IRR 1000ML POUR (IV SOLUTION) ×2 IMPLANT

## 2018-08-06 NOTE — Transfer of Care (Signed)
Immediate Anesthesia Transfer of Care Note  Patient: Aimee Hill  Procedure(s) Performed: BREAST BIOPSY (Left Breast)  Patient Location: PACU  Anesthesia Type:General  Level of Consciousness: drowsy  Airway & Oxygen Therapy: Patient Spontanous Breathing and Patient connected to face mask oxygen  Post-op Assessment: Report given to RN and Post -op Vital signs reviewed and stable  Post vital signs: Reviewed and stable  Last Vitals:  Vitals Value Taken Time  BP 130/69 08/06/2018  9:42 AM  Temp    Pulse 103 08/06/2018  9:44 AM  Resp 15 08/06/2018  9:44 AM  SpO2 99 % 08/06/2018  9:44 AM  Vitals shown include unvalidated device data.  Last Pain:  Vitals:   08/06/18 0942  TempSrc:   PainSc: (P) Asleep         Complications: No apparent anesthesia complications

## 2018-08-06 NOTE — Op Note (Signed)
Preoperative diagnosis: Atypical lobular hyperplasia of the left breast.  Postoperative diagnosis: Same.  Operative procedure: Wide excision of the left breast with ultrasound guidance.  Operating Surgeon: Hervey Ard, MD.  Anesthesia: General by LMA, Marcaine 0.5% with 1 to 200,000 units of epinephrine, 30 cc.  Estimated blood loss: 5 cc.  Clinical note: This 67 year old woman had a mammogram showing a new area of microcalcifications.  Stereotactic biopsy showed a complex sclerosing lesion with atypical lobular hyperplasia.  She is felt to be a candidate for wide excision.  Operative note: The patient underwent general anesthesia without difficulty.  Ultrasound was used to confirm the boundaries of the previous biopsy site.  Local anesthesia was infiltrated and a curvilinear incision made from the 11 to 1 o'clock position about 5 cm from the nipple.  The skin was incised sharply and remaining dissection completed with electrocautery.  Beginning about 1 cm below the skin changes from the biopsy cavity were noted.  A 4 x 4 x 5 cm block of tissue encompassed all the inflammatory changes related to the biopsy and the area noted on ultrasound.  The specimen was orientated and specimen radiograph confirmed the previously placed clip within the central portion of the specimen.  After assuring good hemostasis the adipose tissue was freed from the deep breast parenchyma, and the latter approximated with interrupted 2-0 Vicryl figure-of-eight sutures.  The adipose layer was approximated in a similar fashion.  The skin was closed with a running 4-0 Vicryl sub-particular suture.  Benzoin Steri-Strips were applied.  Telfa dressing placed, fluff gauze and a compressive wrap were applied.  The patient tolerated the procedure well and was taken to recovery room in stable condition.

## 2018-08-06 NOTE — Anesthesia Postprocedure Evaluation (Signed)
Anesthesia Post Note  Patient: Aimee Hill  Procedure(s) Performed: BREAST BIOPSY (Left Breast)  Patient location during evaluation: PACU Anesthesia Type: General Level of consciousness: awake and alert and oriented Pain management: pain level controlled Vital Signs Assessment: post-procedure vital signs reviewed and stable Respiratory status: spontaneous breathing, nonlabored ventilation and respiratory function stable Cardiovascular status: blood pressure returned to baseline and stable Postop Assessment: no signs of nausea or vomiting Anesthetic complications: no     Last Vitals:  Vitals:   08/06/18 1046 08/06/18 1058  BP: 137/75 125/69  Pulse: 90 85  Resp: 14 16  Temp: (!) 36 C   SpO2: 95%     Last Pain:  Vitals:   08/06/18 1058  TempSrc:   PainSc: 0-No pain                 Ziggy Chanthavong

## 2018-08-06 NOTE — Anesthesia Preprocedure Evaluation (Addendum)
Anesthesia Evaluation  Patient identified by MRN, date of birth, ID band Patient awake    Reviewed: Allergy & Precautions, NPO status , Patient's Chart, lab work & pertinent test results  History of Anesthesia Complications (+) PONV and history of anesthetic complications  Airway Mallampati: II  TM Distance: >3 FB Neck ROM: Full    Dental no notable dental hx.    Pulmonary neg pulmonary ROS, neg sleep apnea, neg COPD,    breath sounds clear to auscultation- rhonchi (-) wheezing      Cardiovascular hypertension, Pt. on medications (-) CAD, (-) Past MI, (-) Cardiac Stents and (-) CABG  Rhythm:Regular Rate:Normal - Systolic murmurs and - Diastolic murmurs    Neuro/Psych  Headaches, negative psych ROS   GI/Hepatic Neg liver ROS, GERD  ,  Endo/Other  neg diabetesHypothyroidism   Renal/GU negative Renal ROS     Musculoskeletal  (+) Arthritis ,   Abdominal (+) + obese,   Peds  Hematology negative hematology ROS (+)   Anesthesia Other Findings Past Medical History: No date: Complication of anesthesia     Comment:  TAKES MORE TO SEDATE HER No date: Family history of adverse reaction to anesthesia     Comment:  SISTER N/V 2013: Fracture     Comment:  left foot and second and third toes on left foot No date: GERD (gastroesophageal reflux disease) No date: Headache No date: History of kidney stones No date: Hyperlipidemia No date: Hypertension No date: Hypothyroidism No date: Osteoarthritis No date: PONV (postoperative nausea and vomiting) No date: Urinary calculus   Reproductive/Obstetrics                             Anesthesia Physical Anesthesia Plan  ASA: II  Anesthesia Plan: General   Post-op Pain Management:    Induction: Intravenous  PONV Risk Score and Plan: 3 and Ondansetron, Dexamethasone, Midazolam and Scopolamine patch - Pre-op  Airway Management Planned:  LMA  Additional Equipment:   Intra-op Plan:   Post-operative Plan:   Informed Consent: I have reviewed the patients History and Physical, chart, labs and discussed the procedure including the risks, benefits and alternatives for the proposed anesthesia with the patient or authorized representative who has indicated his/her understanding and acceptance.   Dental advisory given  Plan Discussed with: CRNA and Anesthesiologist  Anesthesia Plan Comments:         Anesthesia Quick Evaluation

## 2018-08-06 NOTE — Anesthesia Post-op Follow-up Note (Signed)
Anesthesia QCDR form completed.        

## 2018-08-06 NOTE — OR Nursing (Signed)
Pt okay to be discharged per Dr Bary Castilla.

## 2018-08-06 NOTE — H&P (Signed)
No change in clinical condition or exam. For left breast biopsy for [facility].

## 2018-08-06 NOTE — Anesthesia Procedure Notes (Signed)
Procedure Name: LMA Insertion Date/Time: 08/06/2018 8:52 AM Performed by: Eben Burow, CRNA Pre-anesthesia Checklist: Patient identified, Emergency Drugs available, Suction available, Patient being monitored and Timeout performed Patient Re-evaluated:Patient Re-evaluated prior to induction Oxygen Delivery Method: Circle system utilized Preoxygenation: Pre-oxygenation with 100% oxygen Induction Type: IV induction LMA: LMA inserted LMA Size: 4.0 Number of attempts: 1 Placement Confirmation: positive ETCO2 and breath sounds checked- equal and bilateral Tube secured with: Tape Dental Injury: Teeth and Oropharynx as per pre-operative assessment

## 2018-08-06 NOTE — Discharge Instructions (Signed)

## 2018-08-10 HISTORY — PX: BREAST LUMPECTOMY: SHX2

## 2018-08-10 LAB — SURGICAL PATHOLOGY

## 2018-08-11 ENCOUNTER — Telehealth: Payer: Self-pay | Admitting: General Surgery

## 2018-08-11 NOTE — Telephone Encounter (Signed)
The patient was notified of the results of last week's biopsy.  Small foci of LCIS.  Complex sclerosing lesion completely excised.  Reviewed that although carcinomas in the name this is not treated his cancer and she will likely be encouraged to make use of a hormone suppressive pill which we will review at time of her office follow-up scheduled on Thursday, August 22.

## 2018-08-13 ENCOUNTER — Ambulatory Visit (INDEPENDENT_AMBULATORY_CARE_PROVIDER_SITE_OTHER): Payer: BLUE CROSS/BLUE SHIELD | Admitting: General Surgery

## 2018-08-13 ENCOUNTER — Encounter: Payer: Self-pay | Admitting: General Surgery

## 2018-08-13 VITALS — BP 158/78 | HR 84 | Resp 14 | Ht 65.0 in | Wt 217.0 lb

## 2018-08-13 DIAGNOSIS — N6092 Unspecified benign mammary dysplasia of left breast: Secondary | ICD-10-CM

## 2018-08-13 MED ORDER — TAMOXIFEN CITRATE 20 MG PO TABS: 20.0000 mg | ORAL_TABLET | Freq: Every day | ORAL | 4 refills | Status: DC

## 2018-08-13 NOTE — Progress Notes (Signed)
[name], female   DOB: 1951/11/09, 67 y.o.   MRN: [id number]  Chief Complaint  Patient presents with  . Routine Post Op    HPI Aimee Hill is a 67 y.o. female here today for her post op left breast biopsy done on 08/06/2018. Patient states she is doing well.  HPI  Past Medical History:  Diagnosis Date  . Complication of anesthesia    TAKES MORE TO SEDATE HER  . Family history of adverse reaction to anesthesia    SISTER N/V  . Fracture 2013   left foot and second and third toes on left foot  . GERD (gastroesophageal reflux disease)   . Headache   . History of kidney stones   . Hyperlipidemia   . Hypertension   . Hypothyroidism   . Osteoarthritis   . PONV (postoperative nausea and vomiting)   . Urinary calculus     Past Surgical History:  Procedure Laterality Date  . BREAST BIOPSY Left 07/22/2018   affirm bx coil marker path pending  . BREAST BIOPSY Left 08/06/2018   Procedure: BREAST BIOPSY;  Surgeon: Robert Bellow, MD;  Location: [facility] ORS;  Service: General;  Laterality: Left;  . BREAST EXCISIONAL BIOPSY Left 08/06/2018   lumpectomy atypical lobular hyperplasia   . COLONOSCOPY  2013   negative/Dr. Kathyrn Sheriff [place]-next one due 2023  . DILATION AND CURETTAGE, DIAGNOSTIC / THERAPEUTIC    . 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 TOOTH EXTRACTION    . TOTAL ABDOMINAL HYSTERECTOMY  1983   Dr. Rayford Halsted  . TUBAL LIGATION    . URETERAL STENT PLACEMENT  1997    Family History  Problem Relation Age of Onset  . Alzheimer's disease Mother        died at age 31 from internal bleeding  . Rheum arthritis Mother   . Brain cancer Father 81  . Lymphoma Paternal Aunt 59  . Colon cancer Paternal Aunt   . Breast cancer Neg Hx     Social History Social History   Tobacco Use  . Smoking status: Never Smoker  . Smokeless tobacco: Never Used  Substance Use Topics  . Alcohol use: No  . Drug use: No     Allergies  Allergen Reactions  . Penicillins Hives    INJECTION ONLY    Current Outpatient Medications  Medication Sig Dispense Refill  . furosemide (LASIX) 20 MG tablet TAKE ONE (1) TABLET EACH DAY    . gabapentin (NEURONTIN) 100 MG capsule Take 400 mg by mouth every morning.   4  . HYDROcodone-acetaminophen (NORCO/VICODIN) 5-325 MG tablet Take 1 tablet by mouth every 4 (four) hours as needed for moderate pain. 12 tablet 0  . levothyroxine (SYNTHROID, LEVOTHROID) 50 MCG tablet Take 50 mcg by mouth daily before breakfast.   10  . meloxicam (MOBIC) 15 MG tablet Take 15 mg by mouth daily.    . metaxalone (SKELAXIN) 800 MG tablet TAKE ONE TABLET BY MOUTH THREE TIMES A DAY AS NEEDED FOR PAIN    . nabumetone (RELAFEN) 500 MG tablet Take 500 mg by mouth 2 (two) times daily.     Marland Kitchen omeprazole (PRILOSEC) 20 MG capsule Take 20 mg by mouth 2 (two) times daily.     . pravastatin (PRAVACHOL) 10 MG tablet Take 10 mg by mouth every morning.   10  . verapamil (CALAN-SR) 120 MG CR tablet Take 120 mg by mouth every morning.    . zolpidem (  AMBIEN) 5 MG tablet Take 5 mg by mouth at bedtime as needed.   5  . tamoxifen (NOLVADEX) 20 MG tablet Take 1 tablet (20 mg total) by mouth daily. 90 tablet 4   No current facility-administered medications for this visit.     Review of Systems Review of Systems  Constitutional: Negative.   Respiratory: Negative.   Cardiovascular: Negative.     Blood pressure (!) 158/78, pulse 84, resp. rate 14, height 5\' 5"  (1.651 m), weight 217 lb (98.4 kg).  Physical Exam Physical Exam  Constitutional: She is oriented to person, place, and time. She appears well-developed and well-nourished.  Pulmonary/Chest:    Neurological: She is alert and oriented to person, place, and time.  Skin: Skin is warm and dry.  Psychiatric: Her behavior is normal.    Data Reviewed August 06, 2018 biopsy: A. 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  - CALCIFICATIONS ASSOCIATED WITH CSL AND LCIS.  - CYSTIC PAPILLARY APOCRINE METAPLASIA.  - BIOPSY SITE CHANGE.   Comment:  The complex sclerosing lesion measures approximately 18 mm. There is a 4  mm focus of lobular carcinoma in situ (LCIS) within the CSL. The complex  sclerosing lesion appears completely excised.   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    Plan Indications for chemoprevention discussed.  Side effects including DVT and vasomotor symptoms reviewed (status post hysterectomy).  Importance of a one-month trial reviewed. Tamoxifen 20 mg daily Follow up in one month. The patient is aware to call back for any questions or new concerns.    HPI, Physical Exam, Assessment and Plan have been scribed under the direction and in the presence of Robert Bellow, MD. Karie Fetch, RN  I have completed the exam and reviewed the above documentation for accuracy and completeness.  I agree with the above.  Haematologist has been used and any errors in dictation or transcription are unintentional.  Hervey Ard, M.D., F.A.C.S.  Forest Gleason Dulcemaria Bula 08/13/2018, 8:55 PM

## 2018-08-13 NOTE — Patient Instructions (Addendum)
The patient is aware to call back for any questions or new concerns.  

## 2018-08-18 DIAGNOSIS — D0580 Other specified type of carcinoma in situ of unspecified breast: Secondary | ICD-10-CM | POA: Insufficient documentation

## 2018-09-10 ENCOUNTER — Encounter: Payer: Self-pay | Admitting: General Surgery

## 2018-09-10 ENCOUNTER — Ambulatory Visit (INDEPENDENT_AMBULATORY_CARE_PROVIDER_SITE_OTHER): Payer: BLUE CROSS/BLUE SHIELD | Admitting: General Surgery

## 2018-09-10 VITALS — BP 130/84 | HR 78 | Resp 13 | Ht 65.0 in | Wt 214.0 lb

## 2018-09-10 DIAGNOSIS — N6092 Unspecified benign mammary dysplasia of left breast: Secondary | ICD-10-CM

## 2018-09-10 DIAGNOSIS — N951 Menopausal and female climacteric states: Secondary | ICD-10-CM

## 2018-09-10 MED ORDER — VENLAFAXINE HCL ER 37.5 MG PO CP24: 37.5000 mg | ORAL_CAPSULE | Freq: Every day | ORAL | 11 refills | Status: DC

## 2018-09-10 NOTE — Progress Notes (Signed)
[name], female   DOB: November 16, 1951, 67 y.o.   MRN: [id number]  Chief Complaint  Patient presents with  . Follow-up    HPI Aimee Hill is a 67 y.o. female here today for her one month follow up left breast wide excision done on 08/06/2018. Patient states she is doing well.  Tamoxifen is making her have lot of hot flashes. She states she wakes up in the middle of the night wet.  HPI  Past Medical History:  Diagnosis Date  . Complication of anesthesia    TAKES MORE TO SEDATE HER  . Family history of adverse reaction to anesthesia    SISTER N/V  . Fracture 2013   left foot and second and third toes on left foot  . GERD (gastroesophageal reflux disease)   . Headache   . History of kidney stones   . Hyperlipidemia   . Hypertension   . Hypothyroidism   . Osteoarthritis   . PONV (postoperative nausea and vomiting)   . Urinary calculus     Past Surgical History:  Procedure Laterality Date  . BREAST BIOPSY Left 07/22/2018   affirm bx coil marker path pending  . BREAST BIOPSY Left 08/06/2018   Procedure: BREAST BIOPSY;  Surgeon: Robert Bellow, MD;  Location: [facility] ORS;  Service: General;  Laterality: Left;  . BREAST EXCISIONAL BIOPSY Left 08/06/2018   lumpectomy atypical lobular hyperplasia   . COLONOSCOPY  2013   negative/Dr. Kathyrn Sheriff [place]-next one due 2023  . DILATION AND CURETTAGE, DIAGNOSTIC / THERAPEUTIC    . 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 TOOTH EXTRACTION    . TOTAL ABDOMINAL HYSTERECTOMY  1983   Dr. Rayford Halsted  . TUBAL LIGATION    . URETERAL STENT PLACEMENT  1997    Family History  Problem Relation Age of Onset  . Alzheimer's disease Mother        died at age 55 from internal bleeding  . Rheum arthritis Mother   . Brain cancer Father 41  . Lymphoma Paternal Aunt 33  . Colon cancer Paternal Aunt   . Breast cancer Neg Hx     Social History Social History   Tobacco Use  . Smoking status:  Never Smoker  . Smokeless tobacco: Never Used  Substance Use Topics  . Alcohol use: No  . Drug use: No    Allergies  Allergen Reactions  . Penicillins Hives    INJECTION ONLY    Current Outpatient Medications  Medication Sig Dispense Refill  . furosemide (LASIX) 20 MG tablet TAKE ONE (1) TABLET EACH DAY    . gabapentin (NEURONTIN) 100 MG capsule Take 400 mg by mouth every morning.   4  . levothyroxine (SYNTHROID, LEVOTHROID) 50 MCG tablet Take 50 mcg by mouth daily before breakfast.   10  . metaxalone (SKELAXIN) 800 MG tablet TAKE ONE TABLET BY MOUTH THREE TIMES A DAY AS NEEDED FOR PAIN    . nabumetone (RELAFEN) 500 MG tablet Take 500 mg by mouth 2 (two) times daily.     Marland Kitchen omeprazole (PRILOSEC) 20 MG capsule Take 20 mg by mouth 2 (two) times daily.     . pravastatin (PRAVACHOL) 10 MG tablet Take 10 mg by mouth every morning.   10  . tamoxifen (NOLVADEX) 20 MG tablet Take 1 tablet (20 mg total) by mouth daily. 90 tablet 4  . verapamil (CALAN-SR) 120 MG CR tablet Take 120 mg by mouth every morning.    Marland Kitchen  zolpidem (AMBIEN) 5 MG tablet Take 5 mg by mouth at bedtime as needed.   5  . venlafaxine XR (EFFEXOR XR) 37.5 MG 24 hr capsule Take 1 capsule (37.5 mg total) by mouth daily with breakfast. 60 capsule 11   No current facility-administered medications for this visit.     Review of Systems Review of Systems  Constitutional: Negative.   Respiratory: Negative.   Cardiovascular: Negative.     Blood pressure 130/84, pulse 78, resp. rate 13, height 5\' 5"  (1.651 m), weight 214 lb (97.1 kg).  Physical Exam Physical Exam  Constitutional: She is oriented to person, place, and time. She appears well-developed and well-nourished.  Pulmonary/Chest: Chest wall is not dull to percussion.  Left breast excision is clean and healing well.     Neurological: She is alert and oriented to person, place, and time.  Skin: Skin is warm and dry.    Data Reviewed A. BREAST, LEFT 12:00; WIDE  EXCISION:  - FOCUS OF LOBULAR CARCINOMA IN SITU (LCIS) ARISING IN COMPLEX  SCLEROSING LESION (CSL) WITH INTRADUCTAL PAPILLOMAS.  - CALCIFICATIONS ASSOCIATED WITH CSL AND LCIS.  - CYSTIC PAPILLARY APOCRINE METAPLASIA.  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  Vasomotor symptoms secondary to antiestrogen therapy.    Plan  The patient is amenable to a trial of Effexor.  She will make use of 37.5 mg daily for 1 week, increasing to 75 mg daily after this time.  She is been asked to give a phone follow-up in 1 month with her progress.  If she continues to have significant symptoms will make use of a trial of letrozole or Arimidex in place of tamoxifen.  We will arrange for a bone density in the near future. Rx sent to pharmacy. Patient to take one tab in morning for the first week and two tabs the second weeks. Call and report in one month. Return in July 2020 for bilateral screening mammogram . The patient is aware to call back for any questions or concerns.   HPI, Physical Exam, Assessment and Plan have been scribed under the direction and in the presence of Hervey Ard, MD.  Gaspar Cola, CMA  I have completed the exam and reviewed the above documentation for accuracy and completeness.  I agree with the above.  Haematologist has been used and any errors in dictation or transcription are unintentional.  Hervey Ard, M.D., F.A.C.S.  Forest Gleason Embry Huss 09/10/2018, 9:16 PM

## 2018-09-10 NOTE — Patient Instructions (Addendum)
Rx sent to pharmacy. Patient to take one tab in morning for the first week and two tabs the second weeks. Call and report in one month. Return in July 2020 for bilateral screening mammogram . The patient is aware to call back for any questions or concerns.

## 2018-09-15 ENCOUNTER — Telehealth: Payer: Self-pay

## 2018-09-15 DIAGNOSIS — N6092 Unspecified benign mammary dysplasia of left breast: Secondary | ICD-10-CM

## 2018-09-15 DIAGNOSIS — Z780 Asymptomatic menopausal state: Secondary | ICD-10-CM

## 2018-09-15 NOTE — Telephone Encounter (Signed)
-----   Message from Robert Bellow, MD sent at 09/10/2018  9:20 PM EDT ----- Please arrange for a bone density exam.

## 2018-09-23 NOTE — Telephone Encounter (Signed)
Spoke with patient and she is amendable to having a bone density test done. She will call [place] herself to have this done as her schedule is very hectic. I have placed the order for Bone Density Scan in Epic.

## 2018-10-07 DIAGNOSIS — N1831 Chronic kidney disease, stage 3a: Secondary | ICD-10-CM | POA: Insufficient documentation

## 2018-10-07 DIAGNOSIS — R7989 Other specified abnormal findings of blood chemistry: Secondary | ICD-10-CM | POA: Insufficient documentation

## 2018-10-26 ENCOUNTER — Other Ambulatory Visit: Payer: Self-pay

## 2018-10-26 MED ORDER — VENLAFAXINE HCL ER 37.5 MG PO CP24: 37.5000 mg | ORAL_CAPSULE | Freq: Two times a day (BID) | ORAL | 11 refills | Status: DC

## 2018-12-02 DIAGNOSIS — E669 Obesity, unspecified: Secondary | ICD-10-CM | POA: Insufficient documentation

## 2019-06-21 ENCOUNTER — Other Ambulatory Visit: Payer: Self-pay | Admitting: *Deleted

## 2019-06-21 DIAGNOSIS — Z1231 Encounter for screening mammogram for malignant neoplasm of breast: Secondary | ICD-10-CM

## 2019-07-12 ENCOUNTER — Encounter: Payer: Self-pay | Admitting: General Surgery

## 2019-08-03 ENCOUNTER — Other Ambulatory Visit: Payer: Self-pay

## 2019-08-03 ENCOUNTER — Ambulatory Visit
Admission: RE | Admit: 2019-08-03 | Discharge: 2019-08-03 | Disposition: A | Discharge status: Home / Self Care | Payer: Medicare Other | Source: Ambulatory Visit | Attending: General Surgery | Admitting: General Surgery

## 2019-08-03 DIAGNOSIS — Z1231 Encounter for screening mammogram for malignant neoplasm of breast: Secondary | ICD-10-CM | POA: Insufficient documentation

## 2019-08-03 IMAGING — MG DIGITAL SCREENING BILATERAL MAMMOGRAM WITH TOMO AND CAD
6 of 10 series · 6 of 30 positions shown · non-contrast
Comparison: Previous exam(s).

CLINICAL DATA: Screening. History of left breast excisional biopsy
for LCIS arising and complex sclerosing lesion.

EXAM:
DIGITAL SCREENING BILATERAL MAMMOGRAM WITH TOMO AND CAD

[L MLO synth-2D (1 of 2)]
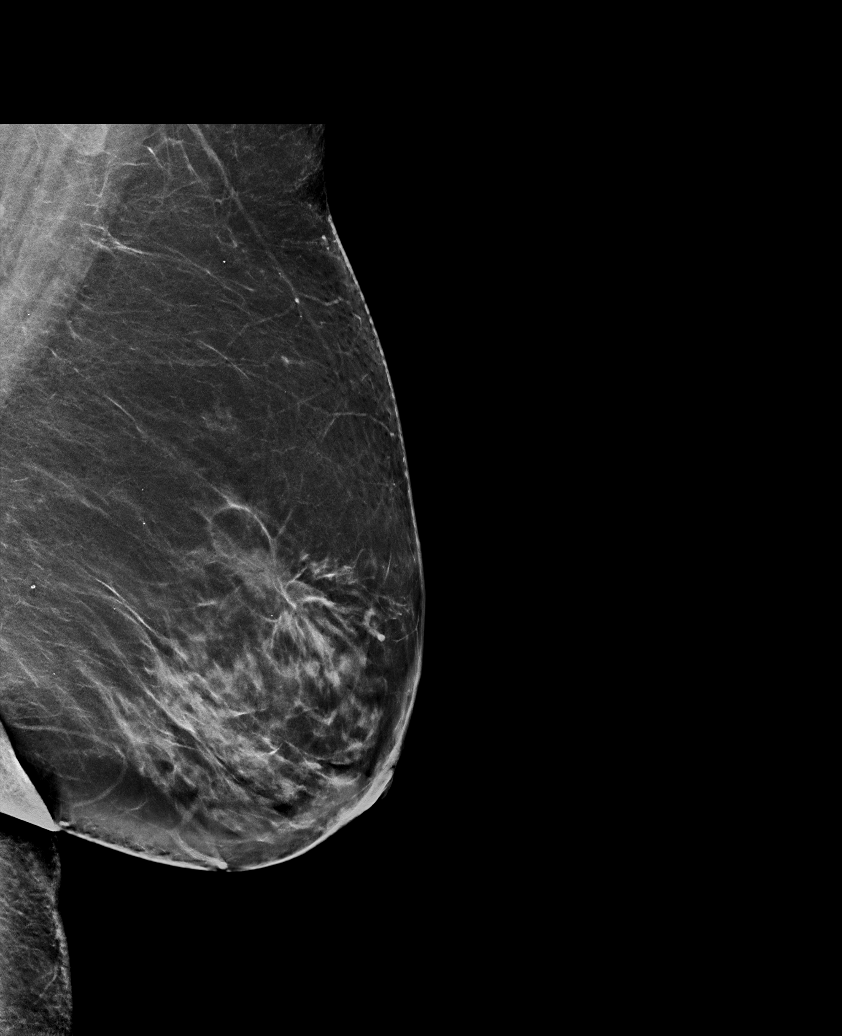

[L MLO synth-2D (2 of 2)]
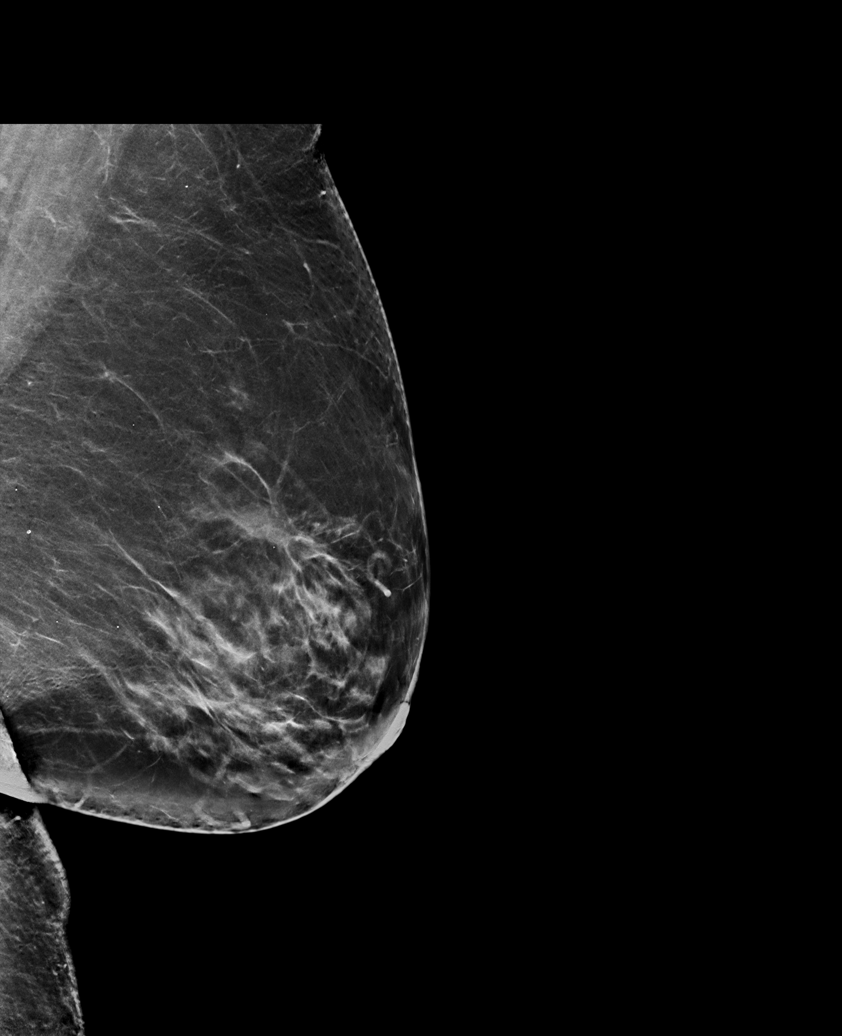

[R MLO synth-2D]
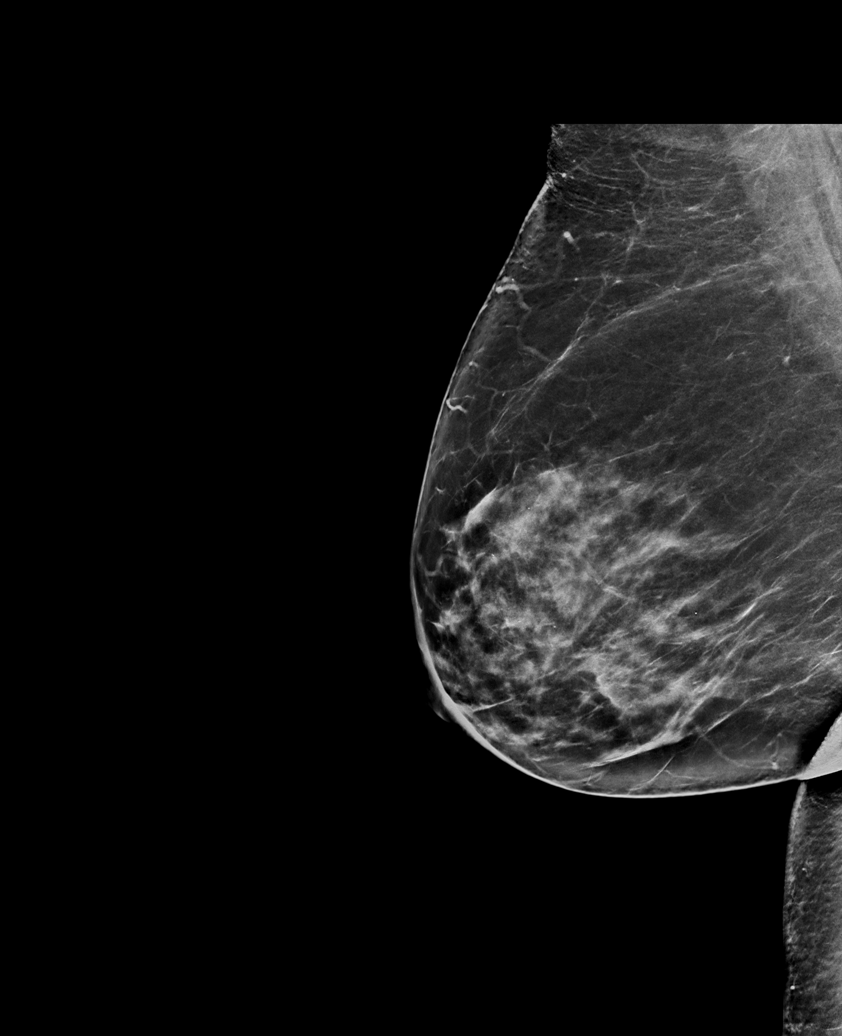

[L CC synth-2D]
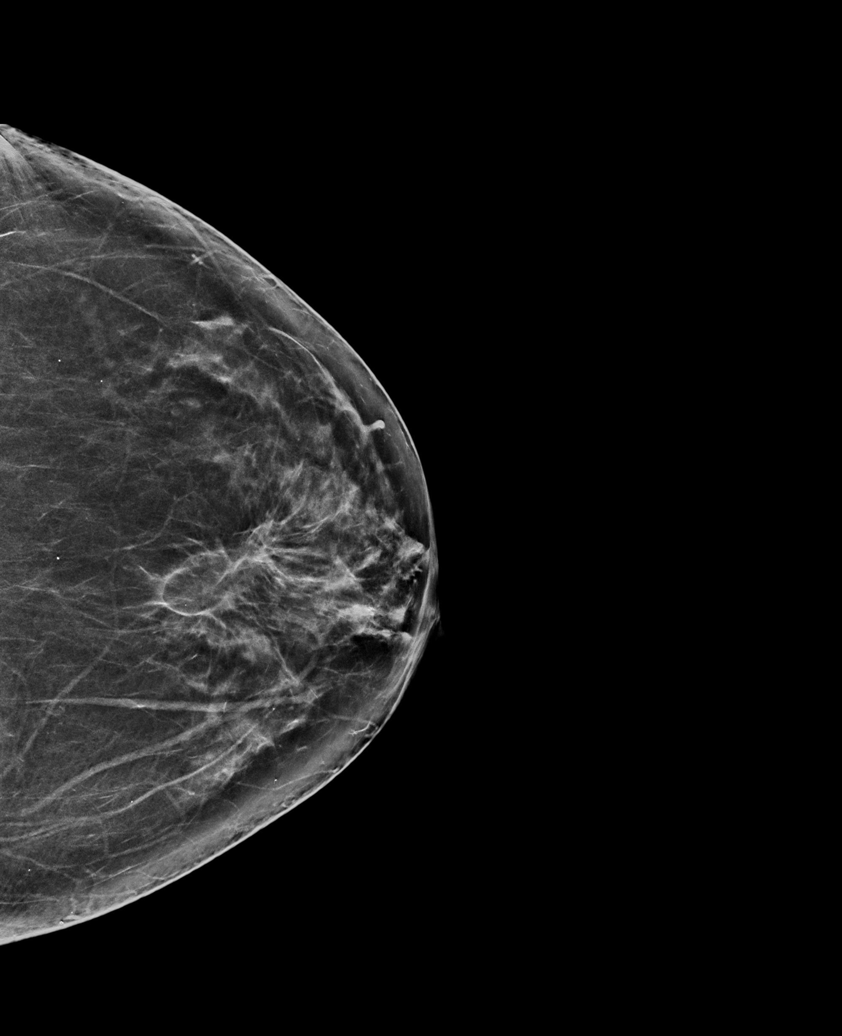

[R CC synth-2D]
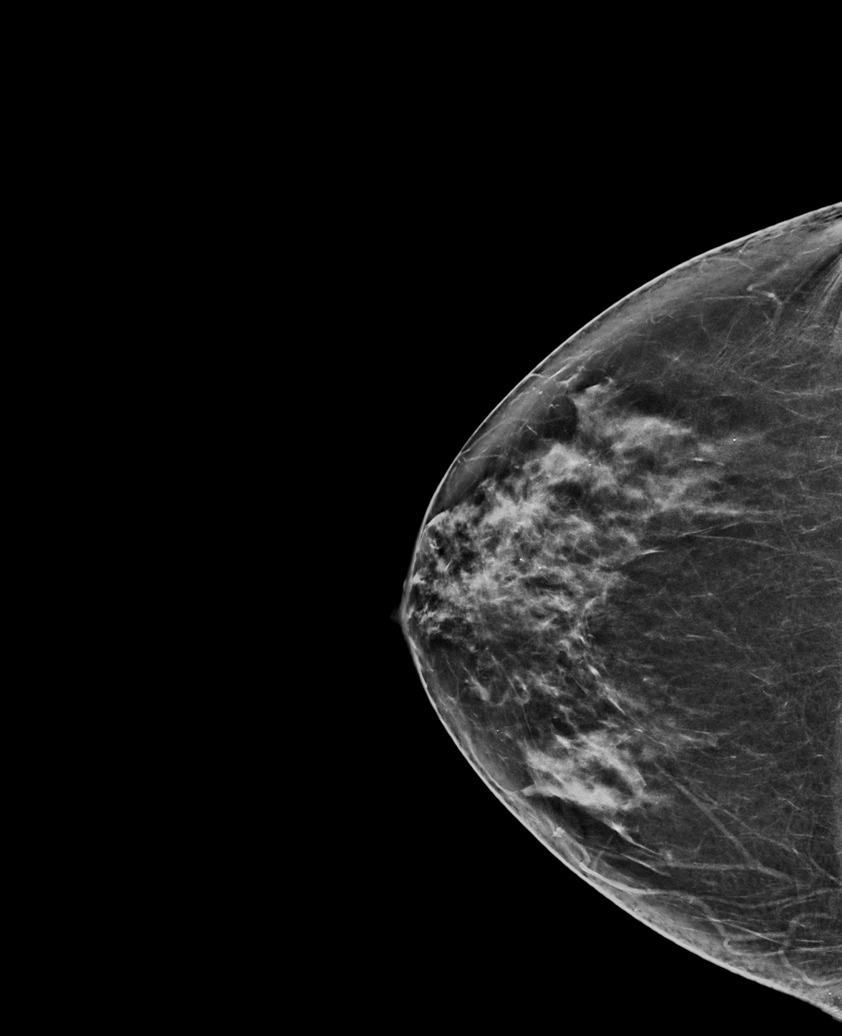

[L MLO tomo · tomo slice 45/88.0]
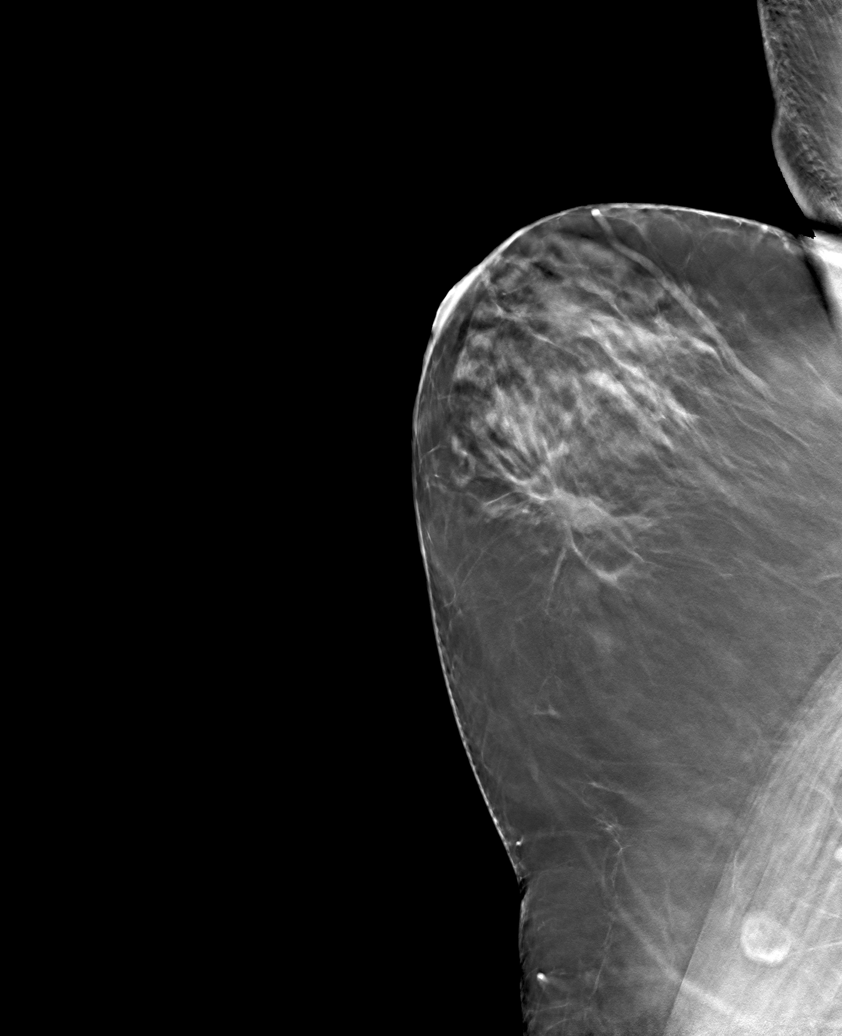

[6 of 30 positions shown; findings below may reference images not displayed]

ACR Breast Density Category c: The breast tissue is heterogeneously
dense, which may obscure small masses.
FINDINGS: There are no findings suspicious for malignancy. Postsurgical
changes in the left breast noted. Images were processed with CAD.
IMPRESSION: No mammographic evidence of malignancy. A result letter of this
screening mammogram will be mailed directly to the patient.

RECOMMENDATION:
Screening mammogram in one year. (Code:[R2])

BI-RADS CATEGORY  1: Negative.

## 2019-08-10 ENCOUNTER — Encounter: Payer: Self-pay | Admitting: Surgery

## 2019-08-10 ENCOUNTER — Ambulatory Visit (INDEPENDENT_AMBULATORY_CARE_PROVIDER_SITE_OTHER): Payer: Medicare Other | Admitting: Surgery

## 2019-08-10 ENCOUNTER — Ambulatory Visit: Payer: BLUE CROSS/BLUE SHIELD | Admitting: General Surgery

## 2019-08-10 ENCOUNTER — Other Ambulatory Visit: Payer: Self-pay

## 2019-08-10 VITALS — BP 136/83 | HR 83 | Temp 97.9°F | Ht 65.0 in | Wt 190.4 lb

## 2019-08-10 DIAGNOSIS — D0502 Lobular carcinoma in situ of left breast: Secondary | ICD-10-CM | POA: Insufficient documentation

## 2019-08-10 DIAGNOSIS — N6092 Unspecified benign mammary dysplasia of left breast: Secondary | ICD-10-CM

## 2019-08-10 NOTE — Progress Notes (Signed)
08/10/2019  History of Present Illness: Aimee Hill is a 68 y.o. female s/p left breast lumpectomy on 08/06/18 revealing LCIS.  Is currently on Tamoxifen.  Had a mammogram on 08/03/19 which was negative for any suspicious findings.  She denies any new palpable masses, skin changes, nipple changes or drainage.  She does self breast exams.  She is having hot flashes from the Tamoxifen and notes that the Effexor Dr. Bary Castilla also prescribed is not helping much.  Past Medical History: Past Medical History:  Diagnosis Date  . Complication of anesthesia    TAKES MORE TO SEDATE HER  . Family history of adverse reaction to anesthesia    SISTER N/V  . Fracture 2013   left foot and second and third toes on left foot  . GERD (gastroesophageal reflux disease)   . Headache   . History of kidney stones   . Hyperlipidemia   . Hypertension   . Hypothyroidism   . Osteoarthritis   . PONV (postoperative nausea and vomiting)   . Urinary calculus      Past Surgical History: Past Surgical History:  Procedure Laterality Date  . BREAST BIOPSY Left 07/22/2018   affirm bx coil marker path pending  . BREAST BIOPSY Left 08/06/2018   Procedure: BREAST BIOPSY;  Surgeon: Robert Bellow, MD;  Location: [facility] ORS;  Service: General;  Laterality: Left;  . BREAST EXCISIONAL BIOPSY Left 08/06/2018   lumpectomy atypical lobular hyperplasia   . BREAST LUMPECTOMY Left 08/10/2018   ADH removed  . COLONOSCOPY  2013   negative/Dr. Kathyrn Sheriff [place]-next one due 2023  . DILATION AND CURETTAGE, DIAGNOSTIC / THERAPEUTIC    . 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 TOOTH EXTRACTION    . TOTAL ABDOMINAL HYSTERECTOMY  1983   Dr. Rayford Halsted  . TUBAL LIGATION    . URETERAL STENT PLACEMENT  1997    Home Medications: Prior to Admission medications   Medication Sig Start Date End Date Taking? Authorizing Provider  furosemide (LASIX) 20 MG tablet TAKE ONE (1) TABLET EACH DAY 12/02/16   Yes [provider]  gabapentin (NEURONTIN) 100 MG capsule Take 400 mg by mouth every morning.  04/28/17  Yes [provider]  levothyroxine (SYNTHROID, LEVOTHROID) 50 MCG tablet Take 50 mcg by mouth daily before breakfast.  04/28/17  Yes [provider]  metaxalone (SKELAXIN) 800 MG tablet TAKE ONE TABLET BY MOUTH THREE TIMES A DAY AS NEEDED FOR PAIN 08/12/16  Yes [provider]  nabumetone (RELAFEN) 500 MG tablet Take 500 mg by mouth 2 (two) times daily.    Yes [provider]  omeprazole (PRILOSEC) 20 MG capsule Take 20 mg by mouth 2 (two) times daily.    Yes [provider]  pravastatin (PRAVACHOL) 10 MG tablet Take 10 mg by mouth every morning.  04/28/17  Yes [provider]  tamoxifen (NOLVADEX) 20 MG tablet Take 1 tablet (20 mg total) by mouth daily. 08/13/18  Yes Robert Bellow, MD  venlafaxine XR (EFFEXOR XR) 37.5 MG 24 hr capsule Take 1 capsule (37.5 mg total) by mouth 2 (two) times daily. 10/26/18  Yes Byrnett, Forest Gleason, MD  verapamil (CALAN-SR) 120 MG CR tablet Take 120 mg by mouth every morning.   Yes [provider]  zolpidem (AMBIEN) 5 MG tablet Take 5 mg by mouth at bedtime as needed.  06/29/18  Yes [provider]    Allergies: Allergies  Allergen Reactions  . Penicillins Hives  INJECTION ONLY    Review of Systems: Review of Systems  Constitutional: Negative for chills and fever.  Respiratory: Negative for shortness of breath.   Cardiovascular: Negative for chest pain.  Gastrointestinal: Negative for nausea and vomiting.  Skin: Negative for rash.    Physical Exam BP 136/83   Pulse 83   Temp 97.9 F (36.6 C) (Temporal)   Ht 5\' 5"  (1.651 m)   Wt 190 lb 6.4 oz (86.4 kg)   SpO2 96%   BMI 31.68 kg/m  CONSTITUTIONAL: No acute distress HEENT:  Normocephalic, atraumatic, extraocular motion intact. RESPIRATORY:  Lungs are clear, and breath sounds are equal bilaterally. Normal respiratory  effort without pathologic use of accessory muscles. CARDIOVASCULAR: Heart is regular without murmurs, gallops, or rubs. BREAST:  Left breast s/p lumpectomy with scar well healed.  No palpable masses, skin changes, nipple retraction or drainage.  No axillary or supraclavicular lymphadenopathy.  Right breast exam without any palpable masses, skin changes, nipple changes, or drainage.  No right axillary or supraclavicular lymphadenopathy. GI: The abdomen is soft, non-distended, non-tender.  NEUROLOGIC:  Motor and sensation is grossly normal.  Cranial nerves are grossly intact. PSYCH:  Alert and oriented to person, place and time. Affect is normal.  Labs/Imaging: Mammogram 08/03/19: FINDINGS: There are no findings suspicious for malignancy. Postsurgical changes in the left breast noted. Images were processed with CAD.  IMPRESSION: No mammographic evidence of malignancy. A result letter of this screening mammogram will be mailed directly to the patient.   Assessment and Plan: This is a 68 y.o. female s/p left lumpectomy revealing LCIS  Discussed with the patient that with Dr. Bary Castilla no longer in our practice, our current surgeons do not manage endocrine therapy and/or its side effects.  We will refer her to the oncology team for management of this.   From surgical standpoint, she is doing well, without any suspicious findings on exam or mammography.  Discussed the need for follow up mammogram for screening and that LCIS does increase her risk for breast cancer in either breast.  She understands this and will continue doing her self exams.  She will follow up with me in one year with bilateral screening mammogram.  Face-to-face time spent with the patient and care providers was 15 minutes, with more than 50% of the time spent counseling, educating, and coordinating care of the patient.     Melvyn Neth, [facility]

## 2019-08-10 NOTE — Patient Instructions (Addendum)
The patient is aware to call back for any questions or new concerns. Ref to oncology. Cancer center will call you for your appt.  Patient will be asked to return to the office in one year with a bilateral screening mammogram.

## 2019-08-13 ENCOUNTER — Other Ambulatory Visit: Payer: Self-pay

## 2019-08-13 ENCOUNTER — Encounter: Payer: Self-pay | Admitting: Oncology

## 2019-08-13 ENCOUNTER — Inpatient Hospital Stay: Payer: Medicare Other | Attending: Oncology | Admitting: Oncology

## 2019-08-13 VITALS — BP 128/77 | HR 76 | Temp 98.9°F | Resp 20 | Ht 65.0 in | Wt 189.0 lb

## 2019-08-13 DIAGNOSIS — Z86000 Personal history of in-situ neoplasm of breast: Secondary | ICD-10-CM

## 2019-08-13 DIAGNOSIS — I10 Essential (primary) hypertension: Secondary | ICD-10-CM | POA: Diagnosis not present

## 2019-08-13 DIAGNOSIS — Z170 Estrogen receptor positive status [ER+]: Secondary | ICD-10-CM | POA: Diagnosis not present

## 2019-08-13 DIAGNOSIS — Z87442 Personal history of urinary calculi: Secondary | ICD-10-CM | POA: Insufficient documentation

## 2019-08-13 DIAGNOSIS — D0502 Lobular carcinoma in situ of left breast: Secondary | ICD-10-CM | POA: Insufficient documentation

## 2019-08-13 DIAGNOSIS — K219 Gastro-esophageal reflux disease without esophagitis: Secondary | ICD-10-CM | POA: Diagnosis not present

## 2019-08-13 DIAGNOSIS — E785 Hyperlipidemia, unspecified: Secondary | ICD-10-CM | POA: Diagnosis not present

## 2019-08-13 DIAGNOSIS — T50905A Adverse effect of unspecified drugs, medicaments and biological substances, initial encounter: Secondary | ICD-10-CM

## 2019-08-13 DIAGNOSIS — Z79810 Long term (current) use of selective estrogen receptor modulators (SERMs): Secondary | ICD-10-CM | POA: Diagnosis not present

## 2019-08-13 DIAGNOSIS — Z79899 Other long term (current) drug therapy: Secondary | ICD-10-CM | POA: Diagnosis not present

## 2019-08-13 DIAGNOSIS — E039 Hypothyroidism, unspecified: Secondary | ICD-10-CM | POA: Diagnosis not present

## 2019-08-13 DIAGNOSIS — R232 Flushing: Secondary | ICD-10-CM | POA: Insufficient documentation

## 2019-08-13 DIAGNOSIS — M1990 Unspecified osteoarthritis, unspecified site: Secondary | ICD-10-CM | POA: Insufficient documentation

## 2019-08-13 NOTE — Progress Notes (Signed)
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  female with PMH listed below was seen in consultation at the request of  Olean Ree, MD  for evaluation of LCIS, management of endocrinology therapy Previously managed by Dr.Byrnett. recently seen by Dr.Piscoya  07/22/2018 left breast biopsy showed complex sclerosis lesion with atypical lobular hyperplasia, duct ectasia.  08/06/2018 left breast excisional biopsy showed focus of LCIS arising from complex  sclerosis lesion with intraductal papillomas. Calcification associated with CSL and LCIS. cystic papillary aprocrine metaplasia.  Patient was started on Tamoxifen for chemoprevention after surgery. She developed severe hot flash and was started on Effexor 37.5 mg daily, later increased to 37.5 mg twice daily.  He reports increased dose helped her symptoms to some extent however she still constantly bothered by symptoms. She was referred to heme-onc for an opinion if any alternatives can be used. No new complaints. Recent mammogram on 08/03/2019 showed no mammographic evidence of malignancy.   Review of Systems  Constitutional: Negative for appetite change, chills, fatigue and fever.  HENT:   Negative for hearing loss and voice change.   Eyes: Negative for eye problems.  Respiratory: Negative for chest tightness and cough.   Cardiovascular: Negative for chest pain.  Gastrointestinal: Negative for abdominal distention, abdominal pain and blood in stool.  Endocrine: Positive for hot flashes.  Genitourinary: Negative for difficulty urinating and frequency.   Musculoskeletal: Negative for arthralgias.  Skin:  Negative for itching and rash.  Neurological: Negative for extremity weakness.  Hematological: Negative for adenopathy.  Psychiatric/Behavioral: Negative for confusion.    MEDICAL HISTORY:  Past Medical History:  Diagnosis Date  . Complication of anesthesia    TAKES MORE TO SEDATE HER  . Family history of adverse reaction to anesthesia    SISTER N/V  . Fracture 2013   left foot and second and third toes on left foot  . GERD (gastroesophageal reflux disease)   . Headache   . History of kidney stones   . Hyperlipidemia   . Hypertension   . Hypothyroidism   . Osteoarthritis   . PONV (postoperative nausea and vomiting)   . Urinary calculus     SURGICAL HISTORY: Past Surgical History:  Procedure Laterality Date  . BREAST BIOPSY Left 07/22/2018   affirm bx coil marker path pending  . BREAST BIOPSY Left 08/06/2018   Procedure: BREAST BIOPSY;  Surgeon: Robert Bellow, MD;  Location: [facility] ORS;  Service: General;  Laterality: Left;  . BREAST EXCISIONAL BIOPSY Left 08/06/2018   lumpectomy atypical lobular hyperplasia   . BREAST LUMPECTOMY Left 08/10/2018   ADH removed  . COLONOSCOPY  2013   negative/Dr. Kathyrn Sheriff [place]-next one due 2023  . DILATION AND CURETTAGE, DIAGNOSTIC / THERAPEUTIC    . 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 TOOTH EXTRACTION    . TOTAL ABDOMINAL HYSTERECTOMY  1983   Dr. Rayford Halsted  . TUBAL LIGATION    . URETERAL STENT PLACEMENT  1997    SOCIAL HISTORY: Social History   Socioeconomic History  . Marital status: Married    Spouse name: Not on file  . Number of children: 1  . Years of education: 72  . Highest education  level: Not on file  Occupational History  . Occupation: Network engineer  Social Needs  . Financial resource strain: Not on file  . Food insecurity    Worry: Not on file    Inability: Not on file  . Transportation needs    Medical: Not on file    Non-medical: Not on file  Tobacco Use  . Smoking  status: Never Smoker  . Smokeless tobacco: Never Used  Substance and Sexual Activity  . Alcohol use: No  . Drug use: No  . Sexual activity: Yes    Partners: Male    Birth control/protection: Surgical  Lifestyle  . Physical activity    Days per week: Not on file    Minutes per session: Not on file  . Stress: Not on file  Relationships  . Social Herbalist on phone: Not on file    Gets together: Not on file    Attends religious service: Not on file    Active member of club or organization: Not on file    Attends meetings of clubs or organizations: Not on file    Relationship status: Not on file  . Intimate partner violence    Fear of current or ex partner: Not on file    Emotionally abused: Not on file    Physically abused: Not on file    Forced sexual activity: Not on file  Other Topics Concern  . Not on file  Social History Narrative  . Not on file    FAMILY HISTORY: Family History  Problem Relation Age of Onset  . Alzheimer's disease Mother        died at age 32 from internal bleeding  . Rheum arthritis Mother   . Brain cancer Father 59  . Lymphoma Paternal Aunt 39  . Colon cancer Paternal Aunt   . Breast cancer Neg Hx     ALLERGIES:  is allergic to penicillins.  MEDICATIONS:  Current Outpatient Medications  Medication Sig Dispense Refill  . furosemide (LASIX) 20 MG tablet 3 (three) times a week.     . levothyroxine (SYNTHROID, LEVOTHROID) 50 MCG tablet Take 50 mcg by mouth daily before breakfast.   10  . metaxalone (SKELAXIN) 800 MG tablet TAKE ONE TABLET BY MOUTH THREE TIMES A DAY AS NEEDED FOR PAIN    . nabumetone (RELAFEN) 500 MG tablet Take 500 mg by mouth 2 (two) times daily.     . pravastatin (PRAVACHOL) 10 MG tablet Take 10 mg by mouth every morning.   10  . tamoxifen (NOLVADEX) 20 MG tablet Take 1 tablet (20 mg total) by mouth daily. 90 tablet 4  . venlafaxine XR (EFFEXOR XR) 37.5 MG 24 hr capsule Take 1 capsule (37.5 mg total) by mouth 2  (two) times daily. 60 capsule 11  . verapamil (CALAN-SR) 120 MG CR tablet Take 120 mg by mouth every morning.    . zolpidem (AMBIEN) 5 MG tablet Take 5 mg by mouth at bedtime as needed.   5  . gabapentin (NEURONTIN) 100 MG capsule Take 400 mg by mouth every morning.   4  . omeprazole (PRILOSEC) 20 MG capsule Take 20 mg by mouth 2 (two) times daily.      No current facility-administered medications for this visit.      PHYSICAL EXAMINATION: ECOG PERFORMANCE STATUS: 1 - Symptomatic but completely ambulatory Vitals:   08/13/19 1052  BP: 128/77  Pulse: 76  Resp: 20  Temp: 98.9 F (37.2 C)  Filed Weights   08/13/19 1052  Weight: 189 lb (85.7 kg)    Physical Exam Constitutional:      General: She is not in acute distress. HENT:     Head: Normocephalic and atraumatic.  Eyes:     General: No scleral icterus.    Pupils: Pupils are equal, round, and reactive to light.  Neck:     Musculoskeletal: Normal range of motion and neck supple.  Cardiovascular:     Rate and Rhythm: Normal rate and regular rhythm.     Heart sounds: Normal heart sounds.  Pulmonary:     Effort: Pulmonary effort is normal. No respiratory distress.     Breath sounds: No wheezing.  Abdominal:     General: Bowel sounds are normal. There is no distension.     Palpations: Abdomen is soft. There is no mass.     Tenderness: There is no abdominal tenderness.  Musculoskeletal: Normal range of motion.        General: No deformity.  Skin:    General: Skin is warm and dry.     Findings: No erythema or rash.  Neurological:     Mental Status: She is alert and oriented to person, place, and time.     Cranial Nerves: No cranial nerve deficit.     Coordination: Coordination normal.  Psychiatric:        Behavior: Behavior normal.        Thought Content: Thought content normal.     LABORATORY DATA:  I have reviewed the data as listed No results found for: WBC, HGB, HCT, MCV, PLT No results for input(s): NA, K,  CL, CO2, GLUCOSE, BUN, CREATININE, CALCIUM, GFRNONAA, GFRAA, PROT, ALBUMIN, AST, ALT, ALKPHOS, BILITOT, BILIDIR, IBILI in the last 8760 hours. Iron/TIBC/Ferritin/ %Sat No results found for: IRON, TIBC, FERRITIN, IRONPCTSAT    RADIOGRAPHIC STUDIES: I have personally reviewed the radiological images as listed and agreed with the findings in the report.  Mm 3d Screen Breast Bilateral  Result Date: 08/03/2019 CLINICAL DATA:  Screening. History of left breast excisional biopsy for LCIS arising and complex sclerosing lesion. EXAM: DIGITAL SCREENING BILATERAL MAMMOGRAM WITH TOMO AND CAD COMPARISON:  Previous exam(s). ACR Breast Density Category c: The breast tissue is heterogeneously dense, which may obscure small masses. FINDINGS: There are no findings suspicious for malignancy. Postsurgical changes in the left breast noted. Images were processed with CAD. IMPRESSION: No mammographic evidence of malignancy. A result letter of this screening mammogram will be mailed directly to the patient. RECOMMENDATION: Screening mammogram in one year. (Code:SM-B-01Y) BI-RADS CATEGORY  1: Negative. Electronically Signed   By: Fidela Salisbury M.D.   On: 08/03/2019 16:39      ASSESSMENT & PLAN:  1. 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 Long-term current use of tamoxifen   3. Hot flash due to medication    LCIS, currently on chemoprevention with tamoxifen. Patient experiences vasomotor symptoms with severe hot flashes. On Effexor 37.5 mg twice daily with some symptom relief. I had a lengthy discussion with patient and discussed about options. There are several options of SSRI which can reduce hot flashesfrom tamoxifen. Effexor, Celexa are good options, though has not been compared head-to-head.  Effexor is a good option and is well studied. Would suggest to avoid strong CYP 2D6 inhibitors such as Paxil and prozac.   Non-SSRI options including gabapentin, vitamin E We discussed about  adding gabapentin.  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  She did not feel gabapentin improved any of her symptoms. Suggest patient to try vitamin E supplementation.  Recommend weight loss and exercise.   #Other options of chemoprevention includes Raloxifen 60mg  daily, which may also cause hot flashes.  Aromatase inhibitor are reasonable alternatives however not approved currently in [place] for chemoprevention for high risk breast lesions  After discussion, patient does not want to try other options.  She is going to try vitamin D supplementation. I offered patient to follow-up with me prefers to in 6 months.  Patient follow-up with surgery and if needed she will call and make follow-up appointments.   All questions were answered. The patient knows to call the clinic with any problems questions or concerns.  cc Olean Ree, MD    Return of visit:  Thank you for this kind referral and the opportunity to participate in the care of this patient. A copy of today's note is routed to referring provider  Total face to face encounter time for this patient visit was 45 min. >50% of the time was  spent in counseling and coordination of care.    Earlie Server, MD, PhD Hematology Oncology [facility] at [facility] Pager- [phone] 08/13/2019

## 2019-08-13 NOTE — Progress Notes (Signed)
Reports being hot all day everyday from Arimidex. Would like to discuss switching or adding something to Effexor with help with these side effects. Currently seeing Dr. Hampton Abbot and he does not manage these medications.

## 2019-09-24 ENCOUNTER — Other Ambulatory Visit: Payer: Self-pay

## 2019-09-24 ENCOUNTER — Telehealth: Payer: Self-pay | Admitting: Surgery

## 2019-09-24 MED ORDER — VENLAFAXINE HCL ER 37.5 MG PO CP24: 37.5000 mg | ORAL_CAPSULE | Freq: Two times a day (BID) | ORAL | 0 refills | Status: DC

## 2019-09-24 NOTE — Telephone Encounter (Signed)
Patient notified that we will refill this medication once, but that she will need to have her primary care provider prescribe this from now on.  We will send her primary care provider a note about this.  Her new primary care provider is  Lorelee New at [facility]: [phone]

## 2019-09-24 NOTE — Telephone Encounter (Signed)
I called and spoke with the office of Rosamaria Lints and informed them that they would need to give any further refills of her Effexor XR. They will call us back with any further questions.

## 2019-09-24 NOTE — Telephone Encounter (Signed)
Patient is calling and is needing a refill on her venlafaxifen XR patient is using [facility] in [place]. Patient said she will be going out of town and only have a couple left.Please call patient and advise when the medication has been sent.

## 2019-10-26 ENCOUNTER — Telehealth: Payer: Self-pay | Admitting: General Practice

## 2019-10-26 NOTE — Telephone Encounter (Signed)
Received refill request from pharmacy for Tamoxifen. Spoke with patient regarding refill on tomoxifen. Per Dr.Piscoya please have Oncologist or PCP to refill.   Patient offered Dr.Byrnetts information however patient asked if she could contact me tomorrow as to her decision as to where she would like this sent to.

## 2019-10-26 NOTE — Telephone Encounter (Signed)
Left message for patient to return call. Dr.Piscoya unable to refill Tomoxifen. Offer patient to see Dr.Byrnett if she would like.

## 2019-10-26 NOTE — Telephone Encounter (Signed)
Patient is calling about a medication and is asking for one of the nurses to give her a call back. Please call patient and advise.

## 2019-10-27 MED ORDER — TAMOXIFEN CITRATE 20 MG PO TABS: 20.0000 mg | ORAL_TABLET | Freq: Every day | ORAL | 0 refills | Status: DC

## 2019-10-27 NOTE — Telephone Encounter (Signed)
Per Dr.Piscoya refill of Tamoxifen 20 mg # 30 no refill can be sent to pharmacy since patient will see PCP next week.

## 2019-11-08 ENCOUNTER — Other Ambulatory Visit: Payer: Self-pay | Admitting: *Deleted

## 2019-11-11 ENCOUNTER — Telehealth: Payer: Self-pay | Admitting: *Deleted

## 2019-11-11 NOTE — Telephone Encounter (Signed)
Patient called asking for refill of her Tamoxifen and Venlafaxine, also asks about follow up appointment as none was made and the wrap up from August appointment says no follow up needed, but office note says she wants 6 month follow up with you. Please advise

## 2019-11-11 NOTE — Telephone Encounter (Signed)
She initially preferred to follow up with surgery only.  Please schedule her to follow up with me in December. Thank you MD only.

## 2019-11-12 ENCOUNTER — Other Ambulatory Visit: Payer: Self-pay | Admitting: *Deleted

## 2019-11-12 NOTE — Telephone Encounter (Signed)
Message sent to scheduling 

## 2019-11-15 ENCOUNTER — Other Ambulatory Visit: Payer: Self-pay | Admitting: *Deleted

## 2019-11-15 ENCOUNTER — Other Ambulatory Visit: Payer: Self-pay

## 2019-11-15 MED ORDER — VENLAFAXINE HCL ER 37.5 MG PO CP24: 37.5000 mg | ORAL_CAPSULE | Freq: Two times a day (BID) | ORAL | 0 refills | Status: DC

## 2019-11-15 NOTE — Telephone Encounter (Signed)
Patient called stating that she needs a refill on tamoxifen and venlafaxine. Pt called last week with this request, but per Dr. Tasia Catchings, she had decided to follow up with surgery only that's why no follow up was scheduled. She called surgery and they told her they will not fill prescription, Dr. Tasia Catchings will have to. She is scheduled to see Dr. Tasia Catchings on 12/14 regarding this, but she states that she is running low on medications and will not have any by the time she see's Dr. Tasia Catchings. She is requesting refill sooner. please advise.

## 2019-11-16 ENCOUNTER — Other Ambulatory Visit: Payer: Self-pay | Admitting: Nurse Practitioner

## 2019-11-16 MED ORDER — TAMOXIFEN CITRATE 20 MG PO TABS: 20.0000 mg | ORAL_TABLET | Freq: Every day | ORAL | 0 refills | Status: DC

## 2019-12-06 ENCOUNTER — Encounter: Payer: Self-pay | Admitting: Oncology

## 2019-12-06 ENCOUNTER — Inpatient Hospital Stay: Payer: Medicare Other | Attending: Oncology | Admitting: Oncology

## 2019-12-06 ENCOUNTER — Other Ambulatory Visit: Payer: Self-pay

## 2019-12-06 DIAGNOSIS — Z79810 Long term (current) use of selective estrogen receptor modulators (SERMs): Secondary | ICD-10-CM | POA: Diagnosis not present

## 2019-12-06 DIAGNOSIS — Z86000 Personal history of in-situ neoplasm of breast: Secondary | ICD-10-CM

## 2019-12-06 DIAGNOSIS — T50905A Adverse effect of unspecified drugs, medicaments and biological substances, initial encounter: Secondary | ICD-10-CM

## 2019-12-06 DIAGNOSIS — D0511 Intraductal carcinoma in situ of right breast: Secondary | ICD-10-CM | POA: Insufficient documentation

## 2019-12-06 DIAGNOSIS — Z170 Estrogen receptor positive status [ER+]: Secondary | ICD-10-CM | POA: Insufficient documentation

## 2019-12-06 DIAGNOSIS — R232 Flushing: Secondary | ICD-10-CM

## 2019-12-06 MED ORDER — VENLAFAXINE HCL ER 37.5 MG PO CP24: 37.5000 mg | ORAL_CAPSULE | Freq: Two times a day (BID) | ORAL | 5 refills | Status: DC

## 2019-12-06 MED ORDER — TAMOXIFEN CITRATE 20 MG PO TABS: 20.0000 mg | ORAL_TABLET | Freq: Every day | ORAL | 5 refills | Status: DC

## 2019-12-06 NOTE — Progress Notes (Signed)
HEMATOLOGY-ONCOLOGY TeleHEALTH VISIT PROGRESS NOTE  I connected with Aimee Hill on 12/06/19 at  2:30 PM EST by video enabled telemedicine visit and verified that I am speaking with the correct person using two identifiers. I discussed the limitations, risks, security and privacy concerns of performing an evaluation and management service by telemedicine and the availability of in-person appointments. I also discussed with the patient that there may be a patient responsible charge related to this service. The patient expressed understanding and agreed to proceed.   Other persons participating in the visit and their role in the encounter:  None  Patient's location: Home  Provider's location: office Chief Complaint: LCIS   INTERVAL HISTORY Aimee Hill is a 68 y.o. female who has above history reviewed by me today presents for follow up visit for management of LCIS Problems and complaints are listed below:  Patient has been on tamoxifen 20 mg daily.  Tolerates well.  She continues to have hot flashes. She takes Effexor 37.5 mg twice daily. She has no new complaints.  Occasionally she has sharp shooting pain to the left breast.  She requests Effexor and tamoxifen refills. Review of Systems  Constitutional: Negative for appetite change, chills, fatigue and fever.  HENT:   Negative for hearing loss and voice change.   Eyes: Negative for eye problems.  Respiratory: Negative for chest tightness and cough.   Cardiovascular: Negative for chest pain.  Gastrointestinal: Negative for abdominal distention, abdominal pain and blood in stool.  Endocrine: Positive for hot flashes.  Genitourinary: Negative for difficulty urinating and frequency.   Musculoskeletal: Negative for arthralgias.  Skin: Negative for itching and rash.  Neurological: Negative for extremity weakness.  Hematological: Negative for adenopathy.  Psychiatric/Behavioral: Negative for confusion.    Past Medical History:   Diagnosis Date  . Complication of anesthesia    TAKES MORE TO SEDATE HER  . Family history of adverse reaction to anesthesia    SISTER N/V  . Fracture 2013   left foot and second and third toes on left foot  . GERD (gastroesophageal reflux disease)   . Headache   . History of kidney stones   . Hyperlipidemia   . Hypertension   . Hypothyroidism   . Osteoarthritis   . PONV (postoperative nausea and vomiting)   . Urinary calculus    Past Surgical History:  Procedure Laterality Date  . BREAST BIOPSY Left 07/22/2018   affirm bx coil marker path pending  . BREAST BIOPSY Left 08/06/2018   Procedure: BREAST BIOPSY;  Surgeon: Robert Bellow, MD;  Location: [facility] ORS;  Service: General;  Laterality: Left;  . BREAST EXCISIONAL BIOPSY Left 08/06/2018   lumpectomy atypical lobular hyperplasia   . BREAST LUMPECTOMY Left 08/10/2018   ADH removed  . COLONOSCOPY  2013   negative/Dr. Kathyrn Sheriff [place]-next one due 2023  . DILATION AND CURETTAGE, DIAGNOSTIC / THERAPEUTIC    . 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 TOOTH EXTRACTION    . TOTAL ABDOMINAL HYSTERECTOMY  1983   Dr. Rayford Halsted  . TUBAL LIGATION    . URETERAL STENT PLACEMENT  1997    Family History  Problem Relation Age of Onset  . Alzheimer's disease Mother        died at age 69 from internal bleeding  . Rheum arthritis Mother   . Brain cancer Father 82  . Lymphoma Paternal Aunt 14  . Colon cancer Paternal Aunt   . Breast cancer Neg Hx  Social History   Socioeconomic History  . Marital status: Married    Spouse name: Not on file  . Number of children: 1  . Years of education: 39  . Highest education level: Not on file  Occupational History  . Occupation: Network engineer  Tobacco Use  . Smoking status: Never Smoker  . Smokeless tobacco: Never Used  Substance and Sexual Activity  . Alcohol use: No  . Drug use: No  . Sexual activity: Yes    Partners: Male    Birth  control/protection: Surgical  Other Topics Concern  . Not on file  Social History Narrative  . Not on file   Social Determinants of Health   Financial Resource Strain:   . Difficulty of Paying Living Expenses: Not on file  Food Insecurity:   . Worried About Charity fundraiser in the Last Year: Not on file  . Ran Out of Food in the Last Year: Not on file  Transportation Needs:   . Lack of Transportation (Medical): Not on file  . Lack of Transportation (Non-Medical): Not on file  Physical Activity:   . Days of Exercise per Week: Not on file  . Minutes of Exercise per Session: Not on file  Stress:   . Feeling of Stress : Not on file  Social Connections:   . Frequency of Communication with Friends and Family: Not on file  . Frequency of Social Gatherings with Friends and Family: Not on file  . Attends Religious Services: Not on file  . Active Member of Clubs or Organizations: Not on file  . Attends Archivist Meetings: Not on file  . Marital Status: Not on file  Intimate Partner Violence:   . Fear of Current or Ex-Partner: Not on file  . Emotionally Abused: Not on file  . Physically Abused: Not on file  . Sexually Abused: Not on file    Current Outpatient Medications on File Prior to Visit  Medication Sig Dispense Refill  . furosemide (LASIX) 20 MG tablet 3 (three) times a week.     . levothyroxine (SYNTHROID, LEVOTHROID) 50 MCG tablet Take 50 mcg by mouth daily before breakfast.   10  . meloxicam (MOBIC) 15 MG tablet Take 15 mg by mouth daily.    . metaxalone (SKELAXIN) 800 MG tablet TAKE ONE TABLET BY MOUTH THREE TIMES A DAY AS NEEDED FOR PAIN    . nabumetone (RELAFEN) 500 MG tablet Take 500 mg by mouth 2 (two) times daily.     Marland Kitchen omeprazole (PRILOSEC) 20 MG capsule Take 20 mg by mouth 2 (two) times daily.     . pravastatin (PRAVACHOL) 10 MG tablet Take 10 mg by mouth every morning.   10  . verapamil (CALAN-SR) 120 MG CR tablet Take 120 mg by mouth every morning.     . zolpidem (AMBIEN) 5 MG tablet Take 5 mg by mouth at bedtime as needed.   5  . gabapentin (NEURONTIN) 100 MG capsule Take 400 mg by mouth every morning.   4  . valACYclovir (VALTREX) 1000 MG tablet Take 1,000 mg by mouth daily as needed.     No current facility-administered medications on file prior to visit.    Allergies  Allergen Reactions  . Penicillins Hives    INJECTION ONLY       Observations/Objective: There were no vitals filed for this visit. There is no height or weight on file to calculate BMI.  Physical Exam  Constitutional: She is oriented to person,  place, and time. No distress.  Neurological: She is alert and oriented to person, place, and time.  Psychiatric: Affect normal.   Labs obtained at [facility] 10/21/2019, CBC showed hemoglobin 11.7, white count 7.99, platelet count 238, Chemistry showed creatinine 1.19, alkaline phosphatase 52, ALT 18, AST 23, bilirubin 0.3. Assessment and Plan: 1. 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 Long-term current use of tamoxifen   3. Hot flash due to medication     Patient has been on chemoprevention with tamoxifen.  She tolerates with moderate difficulties. Tamoxifen prescription was refilled. Patient has tried non-SSRI options including gabapentin and vitamin E.  She has not felt any difference.  Currently not taking gabapentin or vitamin D. We discussed about tamoxifen side effects and symptoms to watch for. Recommend annual  mammogram for surveillance.  Follow Up Instructions: 6 months  I discussed the assessment and treatment plan with the patient. The patient was provided an opportunity to ask questions and all were answered. The patient agreed with the plan and demonstrated an understanding of the instructions.  The patient was advised to call back or seek an in-person evaluation if the symptoms worsen or if the condition fails to improve as anticipated.    Earlie Server, MD 12/06/2019 10:59 PM

## 2019-12-06 NOTE — Progress Notes (Signed)
Patient here for follow up. Complains of occasional sharp shooting pain to left breast. Has only happened like 3 times in the last year. Requesting refill for effexor and tamoxifen.

## 2020-02-07 ENCOUNTER — Ambulatory Visit: Payer: Medicare Other | Attending: Internal Medicine

## 2020-02-07 DIAGNOSIS — Z23 Encounter for immunization: Secondary | ICD-10-CM | POA: Insufficient documentation

## 2020-02-07 NOTE — Progress Notes (Signed)
   Covid-19 Vaccination Clinic  Name:  Aimee Hill    MRN: [id number] DOB: March 20, 1951  02/07/2020  [name] was observed post Covid-19 immunization for 15 minutes without incidence. She was provided with Vaccine Information Sheet and instruction to access the V-Safe system.   [name] was instructed to call 911 with any severe reactions post vaccine: Marland Kitchen Difficulty breathing  . Swelling of your face and throat  . A fast heartbeat  . A bad rash all over your body  . Dizziness and weakness    Immunizations Administered    Name Date Dose VIS Date Route   Pfizer COVID-19 Vaccine 02/07/2020  9:11 AM 0.3 mL 12/03/2019 Intramuscular   Manufacturer: [place]   Lot: [id number]   [place]: [id number]

## 2020-03-07 ENCOUNTER — Ambulatory Visit: Payer: Medicare Other | Attending: Internal Medicine

## 2020-03-07 DIAGNOSIS — Z23 Encounter for immunization: Secondary | ICD-10-CM

## 2020-03-07 NOTE — Progress Notes (Signed)
   Covid-19 Vaccination Clinic  Name:  Aimee Hill    MRN: [id number] DOB: Jan 31, 1951  03/07/2020  Aimee Hill was observed post Covid-19 immunization for 15 minutes without incident. She was provided with Vaccine Information Sheet and instruction to access the V-Safe system.   Aimee Hill was instructed to call 911 with any severe reactions post vaccine: Marland Kitchen Difficulty breathing  . Swelling of face and throat  . A fast heartbeat  . A bad rash all over body  . Dizziness and weakness   Immunizations Administered    Name Date Dose VIS Date Route   Pfizer COVID-19 Vaccine 03/07/2020  8:47 AM 0.3 mL 12/03/2019 Intramuscular   Manufacturer: [place]   Lot: [id number]   [place]: [id number]

## 2020-06-01 ENCOUNTER — Other Ambulatory Visit: Payer: Self-pay

## 2020-06-01 ENCOUNTER — Telehealth: Payer: Self-pay | Admitting: *Deleted

## 2020-06-01 ENCOUNTER — Encounter: Payer: Self-pay | Admitting: Oncology

## 2020-06-01 DIAGNOSIS — Z86000 Personal history of in-situ neoplasm of breast: Secondary | ICD-10-CM

## 2020-06-01 NOTE — Telephone Encounter (Signed)
Per Dr. Tasia Catchings: Just need LFT then. cbc and BMP done at pcp office is good. prefer to be in person as her last visit was virtual but if she insists, ok to do virtual.  Ellison Hughs will you contact pt to schedule lab (LFT) and let her know that Dr. Tasia Catchings would like for her to be seen in person. Thanks

## 2020-06-01 NOTE — Telephone Encounter (Signed)
No, just her cell and home.Everette Rank try her work phone.

## 2020-06-01 NOTE — Telephone Encounter (Signed)
Patient called reporting that she had labs drawn at PCP 05/01/20 and she faxed results to [place] and they are in her chart under lab tablet, she is asking if she needs to have labs drawn again. The only difference is that they drew a BMP and you ordered a CMP. She states that if she must have [place] draw labs, she needs to come today for it. She is also asking if her visit could be changed to virtual visit. Please return call to patient regarding this matter

## 2020-06-01 NOTE — Telephone Encounter (Signed)
Tried calling pt but was unable to reach her by phone. A detailed message was left on her vmail.

## 2020-06-01 NOTE — Telephone Encounter (Signed)
Done...    Pt stated that she had forgot that she was already sched for labs and that she need to be seen in person for a breast exam on 6/14 and to appts sched as is.

## 2020-06-05 ENCOUNTER — Other Ambulatory Visit: Payer: Self-pay

## 2020-06-05 ENCOUNTER — Inpatient Hospital Stay: Payer: Medicare Other | Attending: Oncology

## 2020-06-05 ENCOUNTER — Encounter: Payer: Self-pay | Admitting: Oncology

## 2020-06-05 ENCOUNTER — Inpatient Hospital Stay (HOSPITAL_BASED_OUTPATIENT_CLINIC_OR_DEPARTMENT_OTHER): Payer: Medicare Other | Admitting: Oncology

## 2020-06-05 VITALS — BP 116/67 | HR 75 | Temp 97.0°F | Resp 18 | Wt 196.8 lb

## 2020-06-05 DIAGNOSIS — E039 Hypothyroidism, unspecified: Secondary | ICD-10-CM | POA: Diagnosis not present

## 2020-06-05 DIAGNOSIS — I10 Essential (primary) hypertension: Secondary | ICD-10-CM | POA: Diagnosis not present

## 2020-06-05 DIAGNOSIS — Z803 Family history of malignant neoplasm of breast: Secondary | ICD-10-CM | POA: Diagnosis not present

## 2020-06-05 DIAGNOSIS — Z809 Family history of malignant neoplasm, unspecified: Secondary | ICD-10-CM | POA: Diagnosis not present

## 2020-06-05 DIAGNOSIS — M1990 Unspecified osteoarthritis, unspecified site: Secondary | ICD-10-CM | POA: Insufficient documentation

## 2020-06-05 DIAGNOSIS — Z79810 Long term (current) use of selective estrogen receptor modulators (SERMs): Secondary | ICD-10-CM | POA: Insufficient documentation

## 2020-06-05 DIAGNOSIS — Z79899 Other long term (current) drug therapy: Secondary | ICD-10-CM | POA: Insufficient documentation

## 2020-06-05 DIAGNOSIS — Z86000 Personal history of in-situ neoplasm of breast: Secondary | ICD-10-CM

## 2020-06-05 DIAGNOSIS — T50905A Adverse effect of unspecified drugs, medicaments and biological substances, initial encounter: Secondary | ICD-10-CM

## 2020-06-05 DIAGNOSIS — R232 Flushing: Secondary | ICD-10-CM

## 2020-06-05 DIAGNOSIS — Z170 Estrogen receptor positive status [ER+]: Secondary | ICD-10-CM | POA: Insufficient documentation

## 2020-06-05 DIAGNOSIS — Z87442 Personal history of urinary calculi: Secondary | ICD-10-CM | POA: Diagnosis not present

## 2020-06-05 DIAGNOSIS — Z800 Family history of malignant neoplasm of digestive organs: Secondary | ICD-10-CM | POA: Diagnosis not present

## 2020-06-05 DIAGNOSIS — K219 Gastro-esophageal reflux disease without esophagitis: Secondary | ICD-10-CM | POA: Diagnosis not present

## 2020-06-05 DIAGNOSIS — E785 Hyperlipidemia, unspecified: Secondary | ICD-10-CM | POA: Insufficient documentation

## 2020-06-05 DIAGNOSIS — D0502 Lobular carcinoma in situ of left breast: Secondary | ICD-10-CM | POA: Insufficient documentation

## 2020-06-05 LAB — HEPATIC FUNCTION PANEL
ALT: 16 U/L (ref 0–44)
AST: 21 U/L (ref 15–41)
Albumin: 3.6 g/dL (ref 3.5–5.0)
Alkaline Phosphatase: 58 U/L (ref 38–126)
Bilirubin, Direct: <0.1 mg/dL (ref 0.0–0.2)
Total Bilirubin: 0.4 mg/dL (ref 0.3–1.2)
Total Protein: 7.2 g/dL (ref 6.5–8.1)

## 2020-06-05 MED ORDER — TAMOXIFEN CITRATE 20 MG PO TABS: 20.0000 mg | ORAL_TABLET | Freq: Every day | ORAL | 1 refills | Status: DC

## 2020-06-05 MED ORDER — VENLAFAXINE HCL ER 37.5 MG PO CP24: 37.5000 mg | ORAL_CAPSULE | Freq: Two times a day (BID) | ORAL | 5 refills | Status: DC

## 2020-06-05 MED ORDER — VENLAFAXINE HCL 37.5 MG PO TABS: 37.5000 mg | ORAL_TABLET | Freq: Two times a day (BID) | ORAL | 5 refills | Status: DC

## 2020-06-05 NOTE — Progress Notes (Signed)
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  female with PMH listed below was seen in consultation at the request of  Genoveva Ill, [place]  for evaluation of LCIS, management of endocrinology therapy Previously managed by Dr.Byrnett. recently seen by Dr.Piscoya  07/22/2018 left breast biopsy showed complex sclerosis lesion with atypical lobular hyperplasia, duct ectasia.  08/06/2018 left breast excisional biopsy showed focus of LCIS arising from complex  sclerosis lesion with intraductal papillomas. Calcification associated with CSL and LCIS. cystic papillary aprocrine metaplasia.  Patient was started on Tamoxifen for chemoprevention after surgery. She developed severe hot flash and was started on Effexor 37.5 mg daily, later increased to 37.5 mg twice daily.  He reports increased dose helped her symptoms to some extent however she still constantly bothered by symptoms. She was referred to heme-onc for an opinion if any alternatives can be used. No new complaints. Recent mammogram on 08/03/2019 showed no mammographic evidence of malignancy.  INTERVAL HISTORY Aimee Hill is a 69 y.o. female who has above history reviewed by me today presents for follow up visit for management of LCIS.  Problems and complaints are listed below: Patient has been on Tamoxifen since September 2019. She tolerates well except hot flush. She takes effexor which helps her symptoms.    Review of Systems  Constitutional: Negative for appetite change, chills, fatigue and fever.  HENT:   Negative for hearing loss and voice change.   Eyes: Negative for eye problems.   Respiratory: Negative for chest tightness and cough.   Cardiovascular: Negative for chest pain.  Gastrointestinal: Negative for abdominal distention, abdominal pain and blood in stool.  Endocrine: Positive for hot flashes.  Genitourinary: Negative for difficulty urinating and frequency.   Musculoskeletal: Negative for arthralgias.  Skin: Negative for itching and rash.  Neurological: Negative for extremity weakness.  Hematological: Negative for adenopathy.  Psychiatric/Behavioral: Negative for confusion.    MEDICAL HISTORY:  Past Medical History:  Diagnosis Date  . Complication of anesthesia    TAKES MORE TO SEDATE HER  . Family history of adverse reaction to anesthesia    SISTER N/V  . Fracture 2013   left foot and second and third toes on left foot  . GERD (gastroesophageal reflux disease)   . Headache   . History of kidney stones   . Hyperlipidemia   . Hypertension   . Hypothyroidism   . Osteoarthritis   . PONV (postoperative nausea and vomiting)   . Urinary calculus     SURGICAL HISTORY: Past Surgical History:  Procedure Laterality Date  . BREAST BIOPSY Left 07/22/2018   affirm bx coil marker path pending  . BREAST BIOPSY Left 08/06/2018   Procedure: BREAST BIOPSY;  Surgeon: Robert Bellow, MD;  Location: [facility] ORS;  Service: General;  Laterality: Left;  . BREAST EXCISIONAL BIOPSY Left 08/06/2018   lumpectomy atypical lobular hyperplasia   . BREAST LUMPECTOMY Left 08/10/2018   ADH removed  . COLONOSCOPY  2013   negative/Dr. Kathyrn Sheriff [place]-next one due 2023  . DILATION AND CURETTAGE, DIAGNOSTIC / THERAPEUTIC    . 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 TOOTH EXTRACTION    . TOTAL ABDOMINAL HYSTERECTOMY  1983   Dr. Rayford Halsted  . TUBAL LIGATION    . URETERAL STENT PLACEMENT  1997    SOCIAL HISTORY: Social History   Socioeconomic History  . Marital status: Married    Spouse name: Not on file  . Number of children: 1  .  Years of education: 63  . Highest education level: Not on file  Occupational History  . Occupation: Network engineer  Tobacco Use  . Smoking status: Never Smoker  . Smokeless tobacco: Never Used  Vaping Use  . Vaping Use: Never used  Substance and Sexual Activity  . Alcohol use: No  . Drug use: No  . Sexual activity: Yes    Partners: Male    Birth control/protection: Surgical  Other Topics Concern  . Not on file  Social History Narrative  . Not on file   Social Determinants of Health   Financial Resource Strain:   . Difficulty of Paying Living Expenses:   Food Insecurity:   . Worried About Charity fundraiser in the Last Year:   . Arboriculturist in the Last Year:   Transportation Needs:   . Film/video editor (Medical):   Marland Kitchen Lack of Transportation (Non-Medical):   Physical Activity:   . Days of Exercise per Week:   . Minutes of Exercise per Session:   Stress:   . Feeling of Stress :   Social Connections:   . Frequency of Communication with Friends and Family:   . Frequency of Social Gatherings with Friends and Family:   . Attends Religious Services:   . Active Member of Clubs or Organizations:   . Attends Archivist Meetings:   Marland Kitchen Marital Status:   Intimate Partner Violence:   . Fear of Current or Ex-Partner:   . Emotionally Abused:   Marland Kitchen Physically Abused:   . Sexually Abused:     FAMILY HISTORY: Family History  Problem Relation Age of Onset  . Alzheimer's disease Mother        died at age 29 from internal bleeding  . Rheum arthritis Mother   . Brain cancer Father 50  . Lymphoma Paternal Aunt 76  . Colon cancer Paternal Aunt   . Breast cancer Neg Hx     ALLERGIES:  is allergic to penicillins.  MEDICATIONS:  Current Outpatient Medications  Medication Sig Dispense Refill  . furosemide (LASIX) 20 MG tablet 3 (three) times a week.     . gabapentin (NEURONTIN) 100 MG capsule Take 400 mg by mouth every morning.   4  . levothyroxine (SYNTHROID,  LEVOTHROID) 50 MCG tablet Take 50 mcg by mouth daily before breakfast.   10  . meloxicam (MOBIC) 15 MG tablet Take 15 mg by mouth daily.    . metaxalone (SKELAXIN) 800 MG tablet TAKE ONE TABLET BY MOUTH THREE TIMES A DAY AS NEEDED FOR PAIN    . nabumetone (RELAFEN) 500 MG tablet Take 500 mg by mouth 2 (two) times daily.     Marland Kitchen omeprazole (PRILOSEC) 20 MG capsule Take 20 mg by mouth 2 (two) times daily.     . pravastatin (PRAVACHOL) 10 MG tablet Take 10 mg by mouth every morning.   10  . tamoxifen (NOLVADEX) 20 MG tablet Take 1 tablet (20 mg total) by mouth daily. 30 tablet 5  . valACYclovir (VALTREX) 1000 MG tablet Take 1,000 mg by mouth daily as needed.    . venlafaxine XR (EFFEXOR XR) 37.5 MG 24 hr capsule Take 1 capsule (37.5 mg  total) by mouth 2 (two) times daily. 60 capsule 5  . verapamil (CALAN-SR) 120 MG CR tablet Take 120 mg by mouth every morning.    . zolpidem (AMBIEN) 5 MG tablet Take 5 mg by mouth at bedtime as needed.   5   No current facility-administered medications for this visit.     PHYSICAL EXAMINATION: ECOG PERFORMANCE STATUS: 1 - Symptomatic but completely ambulatory Vitals:   06/05/20 1302  BP: 116/67  Pulse: 75  Resp: 18  Temp: (!) 97 F (36.1 C)   Filed Weights   06/05/20 1302  Weight: 196 lb 12.8 oz (89.3 kg)    Physical Exam Constitutional:      General: She is not in acute distress. HENT:     Head: Normocephalic and atraumatic.  Eyes:     General: No scleral icterus.    Pupils: Pupils are equal, round, and reactive to light.  Cardiovascular:     Rate and Rhythm: Normal rate and regular rhythm.     Heart sounds: Normal heart sounds.  Pulmonary:     Effort: Pulmonary effort is normal. No respiratory distress.     Breath sounds: No wheezing.  Abdominal:     General: Bowel sounds are normal. There is no distension.     Palpations: Abdomen is soft. There is no mass.     Tenderness: There is no abdominal tenderness.  Musculoskeletal:         General: No deformity. Normal range of motion.     Cervical back: Normal range of motion and neck supple.  Skin:    General: Skin is warm and dry.     Findings: No erythema or rash.  Neurological:     Mental Status: She is alert and oriented to person, place, and time.     Cranial Nerves: No cranial nerve deficit.     Coordination: Coordination normal.  Psychiatric:        Behavior: Behavior normal.        Thought Content: Thought content normal.     LABORATORY DATA:  I have reviewed the data as listed No flowsheet data found.    RADIOGRAPHIC STUDIES: I have personally reviewed the radiological images as listed and agreed with the findings in the report. No results found.   ASSESSMENT & PLAN:  1. 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 Long-term current use of tamoxifen   3. Hot flash due to medication    LCIS, currently on chemoprevention with tamoxifen. Patient experiences vasomotor symptoms with severe hot flashes. Continue tamoxifen. Continue Effexor.  37.5 mg twice daily.  #History of hormone replacement.  Personal history of LCIS. Tyrer Cuzick risk model -lifetime risk 38.7%, personal 10-year risk 25% Annual diagnostic mammogram alternate with MRI.  All questions were answered. The patient knows to call the clinic with any problems questions or concerns.  cc Genoveva Ill, FNP    Return of visit: 6 months Thank you for this kind referral and the opportunity to participate in the care of this patient. A copy of today's note is routed to referring provider   Earlie Server, MD, PhD Hematology Oncology [facility] at [facility] Pager- [phone] 06/05/2020

## 2020-06-05 NOTE — Progress Notes (Signed)
Patient denies new problems/concerns today.   °

## 2020-06-06 ENCOUNTER — Telehealth: Payer: Self-pay

## 2020-06-06 DIAGNOSIS — Z86000 Personal history of in-situ neoplasm of breast: Secondary | ICD-10-CM

## 2020-06-06 NOTE — Telephone Encounter (Signed)
I contacted [facility] per [facility] pt only needs a screening mammo due  to her having it exsized in August of 2019. Pt last screening was done on 08/03/2019 She stated that she was going to have the supervisor reach out to Dr.Yu in reference to pts mammo Nothing was changed and her already sched mammo for 07/2020 is still sched for now. Please Advise with any further scheduling changes that needs to be done.

## 2020-06-06 NOTE — Telephone Encounter (Signed)
[facility] and cancelled the Effexor ER prescription.  Confirmed Effexor 37.5 mg rx was received.    Order for screening mammogram has been changed to diagnostic.  Will notify scheduling of order change.

## 2020-06-06 NOTE — Telephone Encounter (Signed)
-----   Message from Earlie Server, MD sent at 06/05/2020  8:39 PM EDT ----- Please contact her pharmacy. Cancel my Effexor 37.5 mg extended release capsule twice daily. I have resent another prescription of Effexor 37.5 mg twice daily.  Also noticed that she was scheduled for bilateral screening mammogram. My recommendation is bilateral diagnostic mammogram. Patient changed.

## 2020-08-03 ENCOUNTER — Other Ambulatory Visit: Payer: Self-pay

## 2020-08-03 ENCOUNTER — Ambulatory Visit
Admission: RE | Admit: 2020-08-03 | Discharge: 2020-08-03 | Disposition: A | Discharge status: Home / Self Care | Payer: Medicare Other | Source: Ambulatory Visit | Attending: Oncology | Admitting: Oncology

## 2020-08-03 DIAGNOSIS — Z86000 Personal history of in-situ neoplasm of breast: Secondary | ICD-10-CM

## 2020-08-03 DIAGNOSIS — Z1231 Encounter for screening mammogram for malignant neoplasm of breast: Secondary | ICD-10-CM | POA: Insufficient documentation

## 2020-08-03 IMAGING — MG DIGITAL SCREENING BILAT W/ TOMO W/ CAD
8 series · 8 of 24 positions shown · non-contrast
Comparison: Previous exam(s).

CLINICAL DATA: Screening.

EXAM:
DIGITAL SCREENING BILATERAL MAMMOGRAM WITH TOMO AND CAD

[L MLO synth-2D]
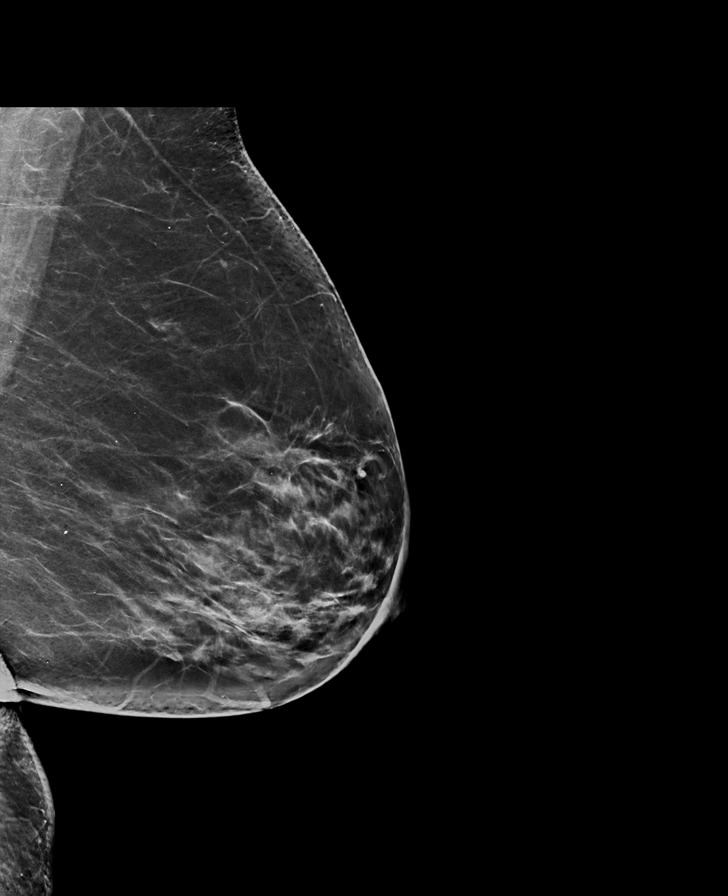

[L CC synth-2D]
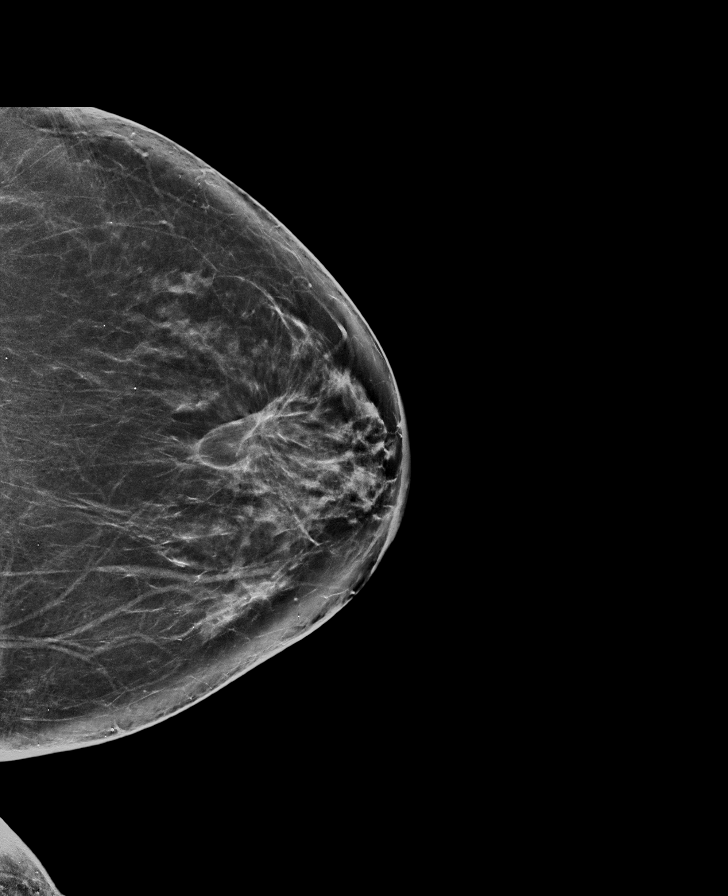

[R MLO synth-2D]
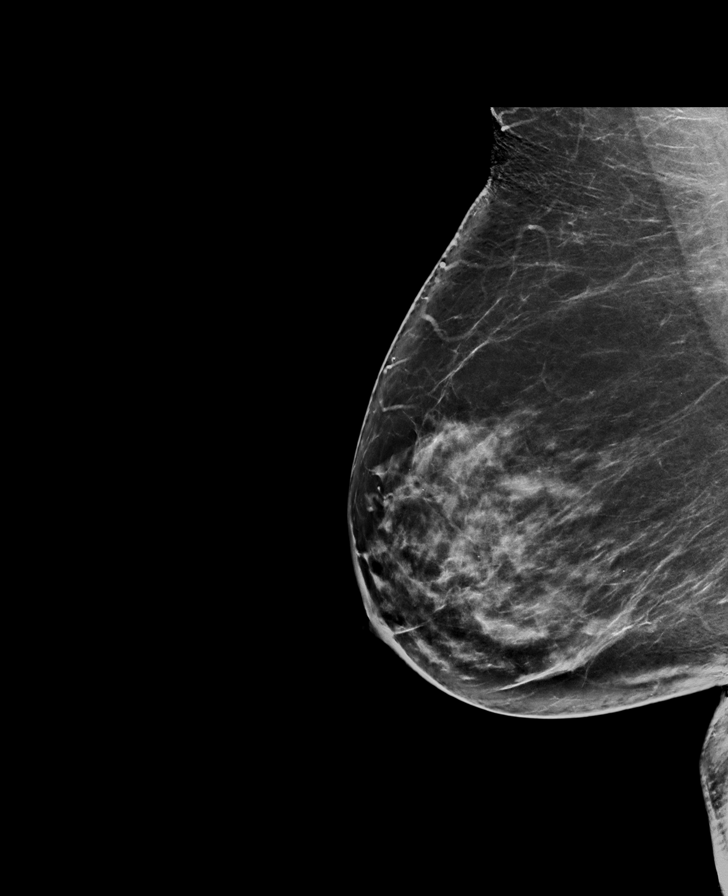

[R CC synth-2D]
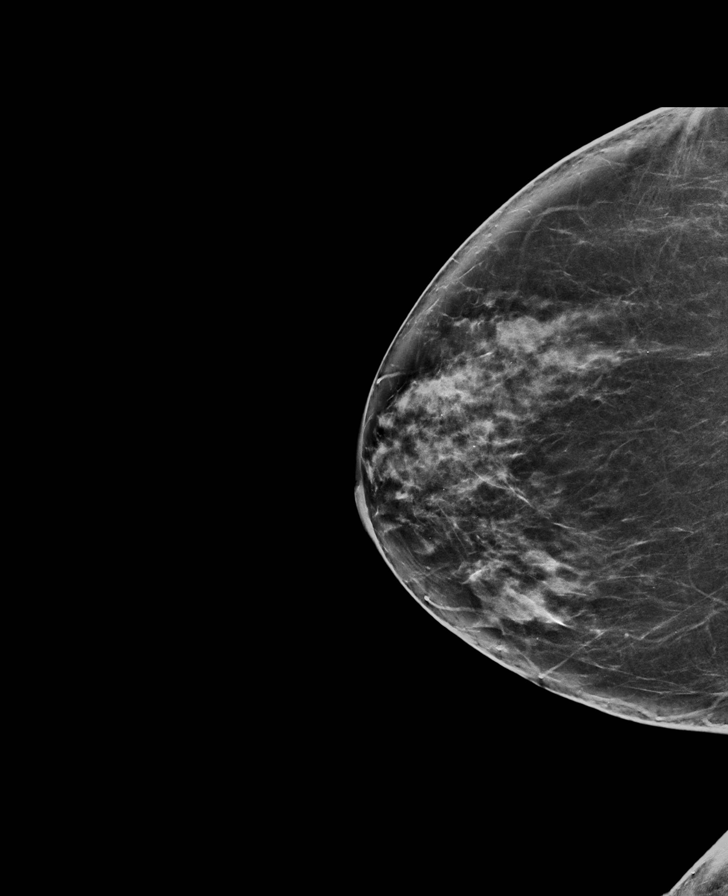

[L CC tomo · tomo slice 39/78.0]
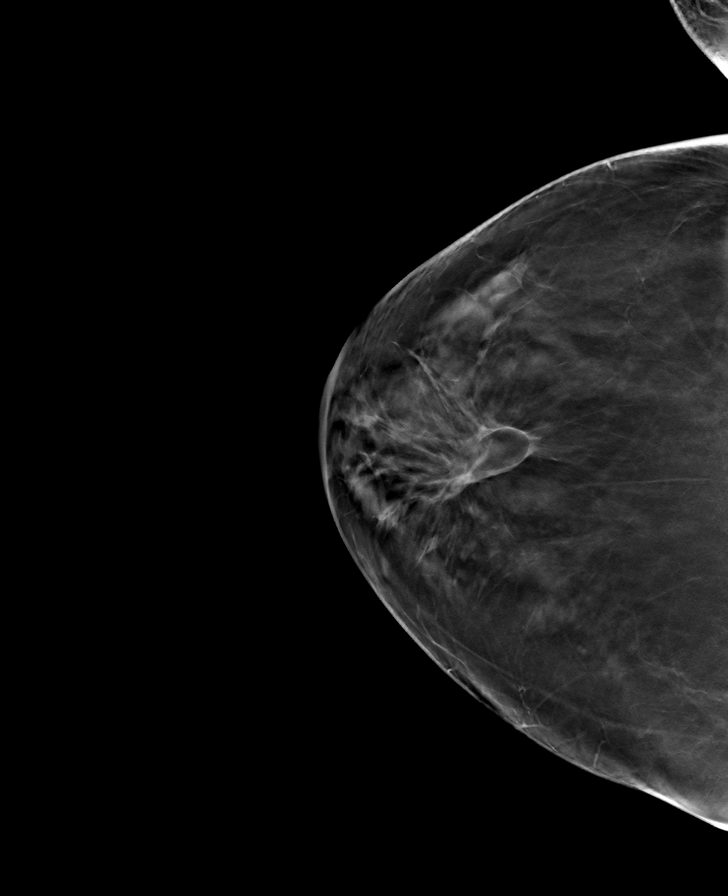

[R CC tomo · tomo slice 39/76.0]
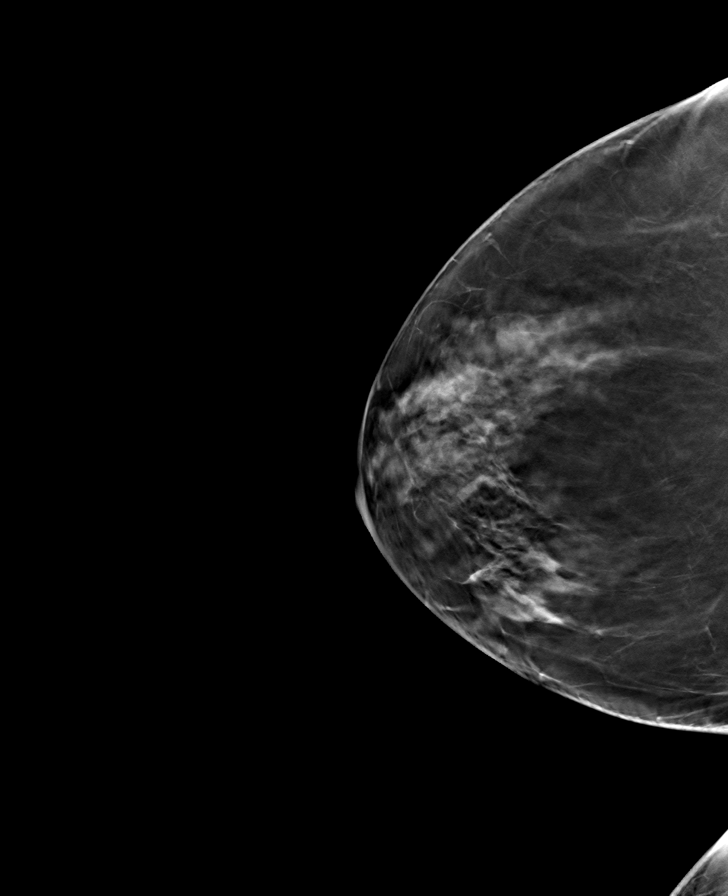

[L MLO tomo · tomo slice 40/79.0]
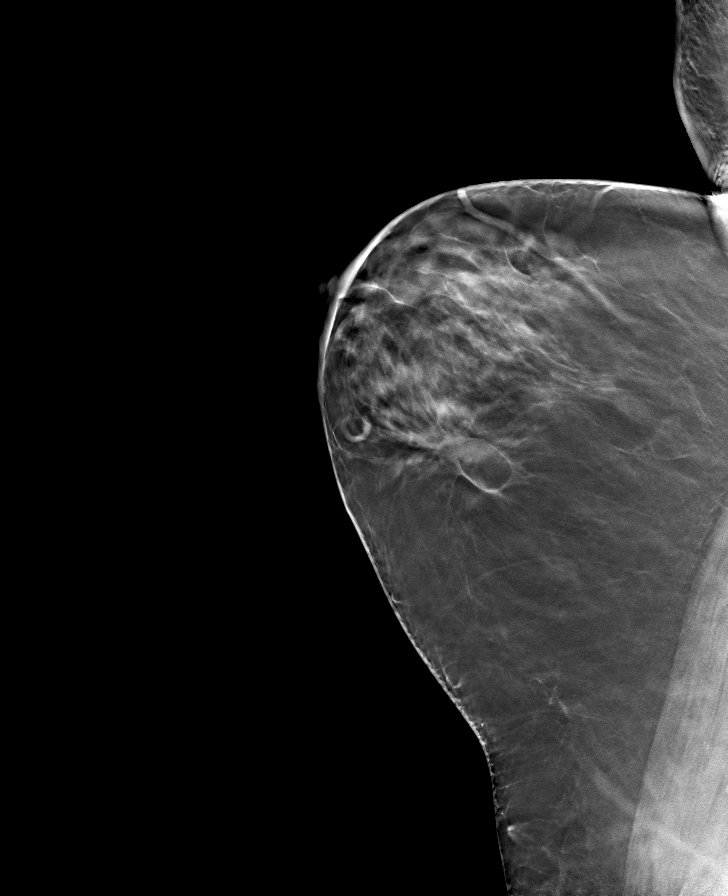

[R MLO tomo · tomo slice 41/81.0]
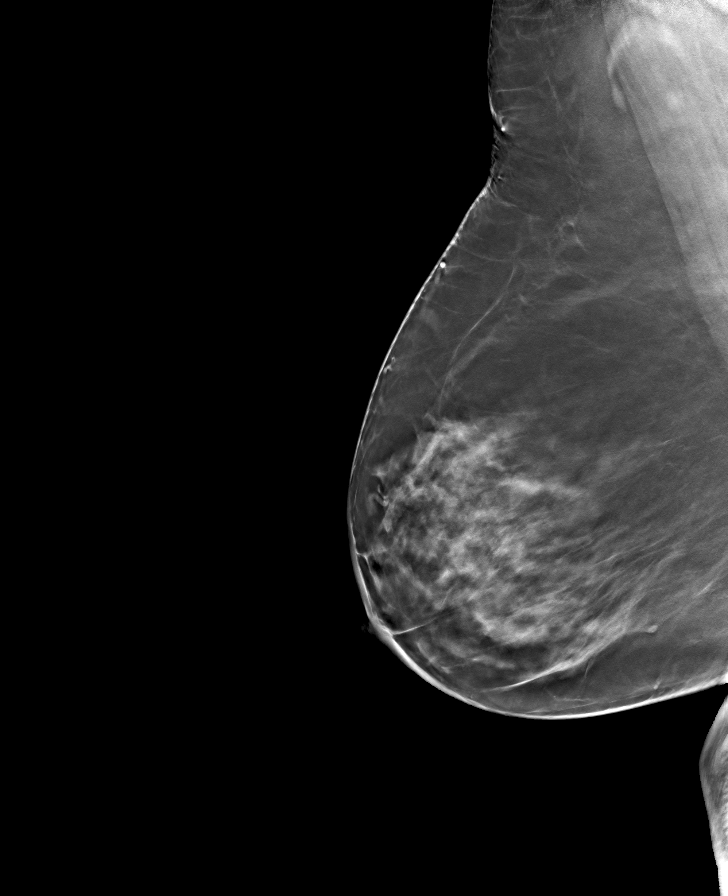

[8 of 24 positions shown; findings below may reference images not displayed]

ACR Breast Density Category c: The breast tissue is heterogeneously
dense, which may obscure small masses.
FINDINGS: There are no findings suspicious for malignancy. Images were
processed with CAD.
IMPRESSION: No mammographic evidence of malignancy. A result letter of this
screening mammogram will be mailed directly to the patient.

RECOMMENDATION:
Screening mammogram in one year. (Code:[5V])

BI-RADS CATEGORY  1: Negative.

## 2020-08-07 ENCOUNTER — Ambulatory Visit: Payer: Medicare Other | Admitting: Surgery

## 2020-11-10 ENCOUNTER — Other Ambulatory Visit: Payer: Self-pay | Admitting: Internal Medicine

## 2020-11-10 DIAGNOSIS — E2839 Other primary ovarian failure: Secondary | ICD-10-CM

## 2020-12-11 ENCOUNTER — Other Ambulatory Visit: Payer: Medicare Other

## 2020-12-11 ENCOUNTER — Ambulatory Visit: Payer: Medicare Other | Admitting: Oncology

## 2020-12-26 ENCOUNTER — Encounter: Payer: Self-pay | Admitting: Oncology

## 2020-12-26 ENCOUNTER — Inpatient Hospital Stay: Payer: Medicare Other

## 2020-12-26 ENCOUNTER — Inpatient Hospital Stay: Payer: Medicare Other | Attending: Oncology

## 2020-12-26 ENCOUNTER — Inpatient Hospital Stay (HOSPITAL_BASED_OUTPATIENT_CLINIC_OR_DEPARTMENT_OTHER): Payer: Medicare Other | Admitting: Oncology

## 2020-12-26 VITALS — BP 126/68 | HR 78 | Temp 96.8°F | Resp 18 | Wt 182.1 lb

## 2020-12-26 DIAGNOSIS — E039 Hypothyroidism, unspecified: Secondary | ICD-10-CM | POA: Diagnosis not present

## 2020-12-26 DIAGNOSIS — M1990 Unspecified osteoarthritis, unspecified site: Secondary | ICD-10-CM | POA: Insufficient documentation

## 2020-12-26 DIAGNOSIS — Z9229 Personal history of other drug therapy: Secondary | ICD-10-CM | POA: Diagnosis not present

## 2020-12-26 DIAGNOSIS — Z170 Estrogen receptor positive status [ER+]: Secondary | ICD-10-CM | POA: Diagnosis not present

## 2020-12-26 DIAGNOSIS — D0502 Lobular carcinoma in situ of left breast: Secondary | ICD-10-CM | POA: Insufficient documentation

## 2020-12-26 DIAGNOSIS — R232 Flushing: Secondary | ICD-10-CM | POA: Diagnosis not present

## 2020-12-26 DIAGNOSIS — Z9189 Other specified personal risk factors, not elsewhere classified: Secondary | ICD-10-CM

## 2020-12-26 DIAGNOSIS — D649 Anemia, unspecified: Secondary | ICD-10-CM

## 2020-12-26 DIAGNOSIS — K219 Gastro-esophageal reflux disease without esophagitis: Secondary | ICD-10-CM | POA: Insufficient documentation

## 2020-12-26 DIAGNOSIS — E785 Hyperlipidemia, unspecified: Secondary | ICD-10-CM | POA: Diagnosis not present

## 2020-12-26 DIAGNOSIS — Z87442 Personal history of urinary calculi: Secondary | ICD-10-CM | POA: Diagnosis not present

## 2020-12-26 DIAGNOSIS — D631 Anemia in chronic kidney disease: Secondary | ICD-10-CM | POA: Insufficient documentation

## 2020-12-26 DIAGNOSIS — Z800 Family history of malignant neoplasm of digestive organs: Secondary | ICD-10-CM | POA: Insufficient documentation

## 2020-12-26 DIAGNOSIS — N1832 Chronic kidney disease, stage 3b: Secondary | ICD-10-CM

## 2020-12-26 DIAGNOSIS — I129 Hypertensive chronic kidney disease with stage 1 through stage 4 chronic kidney disease, or unspecified chronic kidney disease: Secondary | ICD-10-CM | POA: Insufficient documentation

## 2020-12-26 DIAGNOSIS — Z79899 Other long term (current) drug therapy: Secondary | ICD-10-CM | POA: Insufficient documentation

## 2020-12-26 DIAGNOSIS — Z86000 Personal history of in-situ neoplasm of breast: Secondary | ICD-10-CM

## 2020-12-26 DIAGNOSIS — M069 Rheumatoid arthritis, unspecified: Secondary | ICD-10-CM | POA: Insufficient documentation

## 2020-12-26 DIAGNOSIS — G629 Polyneuropathy, unspecified: Secondary | ICD-10-CM | POA: Diagnosis not present

## 2020-12-26 DIAGNOSIS — Z79810 Long term (current) use of selective estrogen receptor modulators (SERMs): Secondary | ICD-10-CM | POA: Diagnosis not present

## 2020-12-26 LAB — CBC WITH DIFFERENTIAL/PLATELET
Abs Immature Granulocytes: 0.04 10*3/uL (ref 0.00–0.07)
Basophils Absolute: 0.1 10*3/uL (ref 0.0–0.1)
Basophils Relative: 1 %
Eosinophils Absolute: 0.2 10*3/uL (ref 0.0–0.5)
Eosinophils Relative: 2 %
HCT: 33.5 % — ABNORMAL LOW (ref 36.0–46.0)
Hemoglobin: 11 g/dL — ABNORMAL LOW (ref 12.0–15.0)
Immature Granulocytes: 1 %
Lymphocytes Relative: 24 %
Lymphs Abs: 1.9 10*3/uL (ref 0.7–4.0)
MCH: 30 pg (ref 26.0–34.0)
MCHC: 32.8 g/dL (ref 30.0–36.0)
MCV: 91.3 fL (ref 80.0–100.0)
Monocytes Absolute: 0.4 10*3/uL (ref 0.1–1.0)
Monocytes Relative: 6 %
Neutro Abs: 5.2 10*3/uL (ref 1.7–7.7)
Neutrophils Relative %: 66 %
Platelets: 213 10*3/uL (ref 150–400)
RBC: 3.67 MIL/uL — ABNORMAL LOW (ref 3.87–5.11)
RDW: 13.6 % (ref 11.5–15.5)
WBC: 7.8 10*3/uL (ref 4.0–10.5)
nRBC: 0 % (ref 0.0–0.2)

## 2020-12-26 LAB — COMPREHENSIVE METABOLIC PANEL
ALT: 12 U/L (ref 0–44)
AST: 19 U/L (ref 15–41)
Albumin: 3.6 g/dL (ref 3.5–5.0)
Alkaline Phosphatase: 55 U/L (ref 38–126)
Anion gap: 8 (ref 5–15)
BUN: 27 mg/dL — ABNORMAL HIGH (ref 8–23)
CO2: 23 mmol/L (ref 22–32)
Calcium: 9.8 mg/dL (ref 8.9–10.3)
Chloride: 109 mmol/L (ref 98–111)
Creatinine, Ser: 1.48 mg/dL — ABNORMAL HIGH (ref 0.44–1.00)
GFR, Estimated: 39 mL/min — ABNORMAL LOW (ref >60)
Glucose, Bld: 94 mg/dL (ref 70–99)
Potassium: 4.1 mmol/L (ref 3.5–5.1)
Sodium: 140 mmol/L (ref 135–145)
Total Bilirubin: 0.5 mg/dL (ref 0.3–1.2)
Total Protein: 7.1 g/dL (ref 6.5–8.1)

## 2020-12-26 MED ORDER — TAMOXIFEN CITRATE 20 MG PO TABS: 20.0000 mg | ORAL_TABLET | Freq: Every day | ORAL | 1 refills | Status: DC

## 2020-12-26 MED ORDER — VENLAFAXINE HCL 37.5 MG PO TABS: 37.5000 mg | ORAL_TABLET | Freq: Two times a day (BID) | ORAL | 5 refills | Status: DC

## 2020-12-26 NOTE — Progress Notes (Signed)
Hematology/Oncology follow up  note [facility] Telephone:(336) [phone] Fax:(336) [phone]   Patient Care Team: Genoveva Ill, [place] as PCP - General (Family Medicine)  REFERRING PROVIDER: Genoveva Ill, FNP  CHIEF COMPLAINTS/REASON FOR VISIT:  Evaluation of LCIS, management of endocrinology therapy  HISTORY OF PRESENTING ILLNESS:   Aimee Hill is a  70 y.o.  female with PMH listed below was seen in consultation at the request of  Genoveva Ill, [place]  for evaluation of LCIS, management of endocrinology therapy Previously managed by Dr.Byrnett. recently seen by Dr.Piscoya  07/22/2018 left breast biopsy showed complex sclerosis lesion with atypical lobular hyperplasia, duct ectasia.  08/06/2018 left breast excisional biopsy showed focus of LCIS arising from complex  sclerosis lesion with intraductal papillomas. Calcification associated with CSL and LCIS. cystic papillary aprocrine metaplasia.  Patient was started on Tamoxifen for chemoprevention after surgery. She developed severe hot flash and was started on Effexor 37.5 mg daily, later increased to 37.5 mg twice daily.  He reports increased dose helped her symptoms to some extent however she still constantly bothered by symptoms. She was referred to heme-onc for an opinion if any alternatives can be used. No new complaints. Recent mammogram on 08/03/2019 showed no mammographic evidence of malignancy.  INTERVAL HISTORY Aimee Hill is a 70 y.o. female who has above history reviewed by me today presents for follow up visit for management of LCIS.  Problems and complaints are listed below: Patient has been taking tamoxifen since September 2019.  Patient tolerates well.  Vasomotor symptoms are controlled with Effexor.  No new complaints.  Review of Systems  Constitutional: Negative for appetite change, chills, fatigue and fever.  HENT:   Negative for hearing loss and voice change.   Eyes: Negative for eye  problems.  Respiratory: Negative for chest tightness and cough.   Cardiovascular: Negative for chest pain.  Gastrointestinal: Negative for abdominal distention, abdominal pain and blood in stool.  Endocrine: Positive for hot flashes.  Genitourinary: Negative for difficulty urinating and frequency.   Musculoskeletal: Negative for arthralgias.  Skin: Negative for itching and rash.  Neurological: Negative for extremity weakness.  Hematological: Negative for adenopathy.  Psychiatric/Behavioral: Negative for confusion.    MEDICAL HISTORY:  Past Medical History:  Diagnosis Date  . Complication of anesthesia    TAKES MORE TO SEDATE HER  . Family history of adverse reaction to anesthesia    SISTER N/V  . Fracture 2013   left foot and second and third toes on left foot  . GERD (gastroesophageal reflux disease)   . Headache   . History of kidney stones   . Hyperlipidemia   . Hypertension   . Hypothyroidism   . Osteoarthritis   . PONV (postoperative nausea and vomiting)   . Urinary calculus     SURGICAL HISTORY: Past Surgical History:  Procedure Laterality Date  . BREAST BIOPSY Left 07/22/2018   affirm bx coil marker path pending  . BREAST BIOPSY Left 08/06/2018   Procedure: BREAST BIOPSY;  Surgeon: Robert Bellow, MD;  Location: [facility] ORS;  Service: General;  Laterality: Left;  . BREAST EXCISIONAL BIOPSY Left 08/06/2018   lumpectomy atypical lobular hyperplasia   . BREAST LUMPECTOMY Left 08/10/2018   ADH removed  . COLONOSCOPY  2013   negative/Dr. Kathyrn Sheriff [place]-next one due 2023  . DILATION AND CURETTAGE, DIAGNOSTIC / THERAPEUTIC    . 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 TOOTH EXTRACTION    . TOTAL ABDOMINAL HYSTERECTOMY  1983   Dr. Harold Hedge  . TUBAL LIGATION    . URETERAL STENT PLACEMENT  1997    SOCIAL HISTORY: Social History   Socioeconomic History  . Marital status: Married    Spouse name: Not on file  . Number of  children: 1  . Years of education: 17  . Highest education level: Not on file  Occupational History  . Occupation: Diplomatic Services operational officer  Tobacco Use  . Smoking status: Never Smoker  . Smokeless tobacco: Never Used  Vaping Use  . Vaping Use: Never used  Substance and Sexual Activity  . Alcohol use: No  . Drug use: No  . Sexual activity: Yes    Partners: Male    Birth control/protection: Surgical  Other Topics Concern  . Not on file  Social History Narrative  . Not on file   Social Determinants of Health   Financial Resource Strain: Not on file  Food Insecurity: Not on file  Transportation Needs: Not on file  Physical Activity: Not on file  Stress: Not on file  Social Connections: Not on file  Intimate Partner Violence: Not on file    FAMILY HISTORY: Family History  Problem Relation Age of Onset  . Alzheimer's disease Mother        died at age 19 from internal bleeding  . Rheum arthritis Mother   . Brain cancer Father 33  . Lymphoma Paternal Aunt 33  . Colon cancer Paternal Aunt   . Breast cancer Neg Hx     ALLERGIES:  is allergic to penicillins.  MEDICATIONS:  Current Outpatient Medications  Medication Sig Dispense Refill  . furosemide (LASIX) 20 MG tablet 3 (three) times a week.     . levothyroxine (SYNTHROID, LEVOTHROID) 50 MCG tablet Take 50 mcg by mouth daily before breakfast.   10  . meloxicam (MOBIC) 15 MG tablet Take 15 mg by mouth daily.    . metaxalone (SKELAXIN) 800 MG tablet TAKE ONE TABLET BY MOUTH THREE TIMES A DAY AS NEEDED FOR PAIN    . nabumetone (RELAFEN) 500 MG tablet Take 500 mg by mouth 2 (two) times daily.     Marland Kitchen omeprazole (PRILOSEC) 20 MG capsule Take 20 mg by mouth 2 (two) times daily.     . pravastatin (PRAVACHOL) 10 MG tablet Take 10 mg by mouth every morning.   10  . tamoxifen (NOLVADEX) 20 MG tablet Take 1 tablet (20 mg total) by mouth daily. 90 tablet 1  . valACYclovir (VALTREX) 1000 MG tablet Take 1,000 mg by mouth daily as needed.    .  venlafaxine (EFFEXOR) 37.5 MG tablet Take 1 tablet (37.5 mg total) by mouth 2 (two) times daily. 60 tablet 5  . verapamil (CALAN-SR) 120 MG CR tablet Take 120 mg by mouth every morning.    . zolpidem (AMBIEN) 5 MG tablet Take 5 mg by mouth at bedtime as needed.   5  . gabapentin (NEURONTIN) 100 MG capsule Take 400 mg by mouth every morning.  (Patient not taking: Reported on 12/26/2020)  4   No current facility-administered medications for this visit.     PHYSICAL EXAMINATION: ECOG PERFORMANCE STATUS: 1 - Symptomatic but completely ambulatory Vitals:   12/26/20 1019  BP: 126/68  Pulse: 78  Resp: 18  Temp: (!) 96.8 F (36 C)   Filed Weights   12/26/20 1019  Weight: 182 lb 1.6 oz (82.6 kg)    Physical Exam Constitutional:      General: She is not in acute distress.  HENT:     Head: Normocephalic and atraumatic.  Eyes:     General: No scleral icterus.    Pupils: Pupils are equal, round, and reactive to light.  Cardiovascular:     Rate and Rhythm: Normal rate and regular rhythm.     Heart sounds: Normal heart sounds.  Pulmonary:     Effort: Pulmonary effort is normal. No respiratory distress.     Breath sounds: No wheezing.  Abdominal:     General: Bowel sounds are normal. There is no distension.     Palpations: Abdomen is soft. There is no mass.     Tenderness: There is no abdominal tenderness.  Musculoskeletal:        General: No deformity. Normal range of motion.     Cervical back: Normal range of motion and neck supple.  Skin:    General: Skin is warm and dry.     Findings: No erythema or rash.  Neurological:     Mental Status: She is alert and oriented to person, place, and time.     Cranial Nerves: No cranial nerve deficit.     Coordination: Coordination normal.  Psychiatric:        Behavior: Behavior normal.        Thought Content: Thought content normal.   Breast exam was performed in seated and lying down position. Patient is status post left lumpectomy with  a well-healed surgical scar.  No palpable breast masses bilaterally.  No palpable axillary lymph adenopathy bilaterally.  LABORATORY DATA:  I have reviewed the data as listed CBC Latest Ref Rng & Units 12/26/2020  WBC 4.0 - 10.5 K/uL 7.8  Hemoglobin 12.0 - 15.0 g/dL 11.0(L)  Hematocrit 36.0 - 46.0 % 33.5(L)  Platelets 150 - 400 K/uL 213      RADIOGRAPHIC STUDIES: I have personally reviewed the radiological images as listed and agreed with the findings in the report. No results found.   ASSESSMENT & PLAN:  1. 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 At increased risk of breast cancer   3. History of hormone replacement therapy   4. Stage 3b chronic kidney disease ([place])   5. Normocytic anemia    LCIS, currently on chemoprevention with tamoxifen. Continue tamoxifen 20 mg daily.  Continue Effexor.  37.5 mg twice daily.  #History of hormone replacement.  Personal history of LCIS. Tyrer Cuzick risk model -lifetime risk 38.7%, personal 10-year risk 25% Annual diagnostic mammogram alternate with MRI.  I will obtain bilateral breast MRI in February/March  #Chronic kidney disease.  Labs were reviewed and discussed with patient.  She has had chronic renal impairment.  Today's creatinine is 1.48. Patient uses NSAIDs and recently was advised by primary care provider to stop.  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  In the context of anemia and CKD, I will check multiple myeloma panel.  #Anemia, mild.  I will check anemia work-up at the next visit. All questions were answered. The patient knows to call the clinic with any problems questions or concerns.  cc Genoveva Ill, FNP    Return of visit: 6 months Earlie Server, MD, PhD Hematology Oncology [facility] at [address] Pager- [phone] 12/26/2020

## 2020-12-26 NOTE — Addendum Note (Signed)
Addended by: Rickard Patience on: 12/26/2020 08:07 PM   Modules accepted: Orders

## 2020-12-26 NOTE — Progress Notes (Signed)
Pt here for follow up. No new concerns voiced.   

## 2020-12-27 LAB — KAPPA/LAMBDA LIGHT CHAINS
Kappa free light chain: 29.1 mg/L — ABNORMAL HIGH (ref 3.3–19.4)
Kappa, lambda light chain ratio: 1.21 (ref 0.26–1.65)
Lambda free light chains: 24 mg/L (ref 5.7–26.3)

## 2020-12-28 LAB — MULTIPLE MYELOMA PANEL, SERUM
Albumin SerPl Elph-Mcnc: 3.6 g/dL (ref 2.9–4.4)
Albumin/Glob SerPl: 1.1 (ref 0.7–1.7)
Alpha 1: 0.3 g/dL (ref 0.0–0.4)
Alpha2 Glob SerPl Elph-Mcnc: 0.7 g/dL (ref 0.4–1.0)
B-Globulin SerPl Elph-Mcnc: 1.1 g/dL (ref 0.7–1.3)
Gamma Glob SerPl Elph-Mcnc: 1.3 g/dL (ref 0.4–1.8)
Globulin, Total: 3.3 g/dL (ref 2.2–3.9)
IgA: 304 mg/dL (ref 87–352)
IgG (Immunoglobin G), Serum: 1277 mg/dL (ref 586–1602)
IgM (Immunoglobulin M), Srm: 231 mg/dL — ABNORMAL HIGH (ref 26–217)
Total Protein ELP: 6.9 g/dL (ref 6.0–8.5)

## 2021-01-23 ENCOUNTER — Other Ambulatory Visit: Payer: Self-pay

## 2021-01-23 ENCOUNTER — Ambulatory Visit
Admission: RE | Admit: 2021-01-23 | Discharge: 2021-01-23 | Disposition: A | Discharge status: Home / Self Care | Payer: Medicare Other | Source: Ambulatory Visit | Attending: Oncology | Admitting: Oncology

## 2021-01-23 DIAGNOSIS — Z86000 Personal history of in-situ neoplasm of breast: Secondary | ICD-10-CM | POA: Diagnosis present

## 2021-01-23 IMAGING — MR MR BREAST BILAT WO/W CM
3 of 10 series · 12 of 48 positions shown · IV contrast (8ml Gadavist)
Comparison: Bilateral screening mammogram dated [DATE]

CLINICAL DATA: Status post left breast excision in [U1]
demonstrating LCIS arising in a complex sclerosing lesion with
intraductal papillomas.

LABS:  None obtained on site today.
EXAM:
BILATERAL BREAST MRI WITH AND WITHOUT CONTRAST
TECHNIQUE: Multiplanar, multisequence MR images of both breasts were obtained
prior to and following the intravenous administration of 8 ml of
Gadavist

[Series 2: T1 · axial · B · 1.5mm · 1.02mm/px · z∈[-84,+82]mm · 6 of 112 slices shown]
[im 1/112]
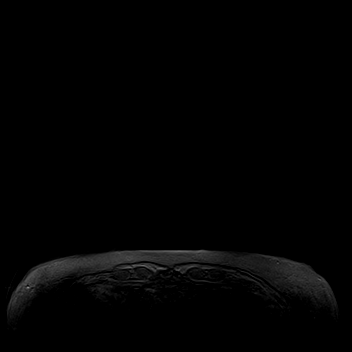
[im 23/112]
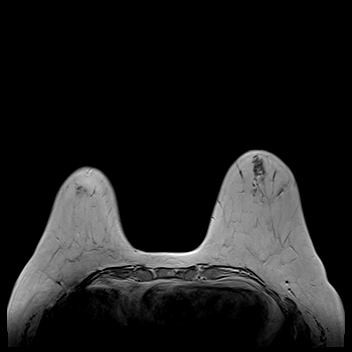
[im 45/112]
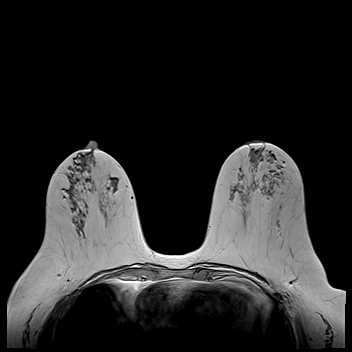
[im 67/112]
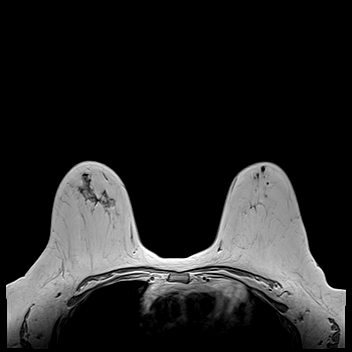
[im 89/112]
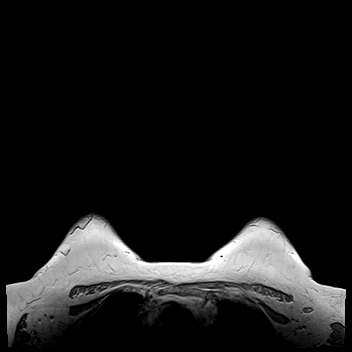
[im 112/112]
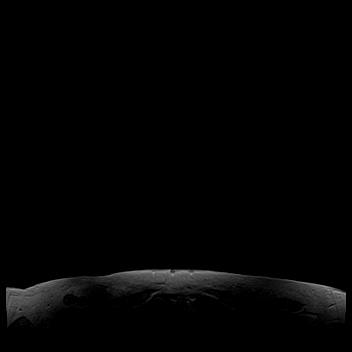

[Series 3: T2 · axial · B · 3.0mm · 1.02mm/px · z∈[-82,+80]mm · 3 of 46 slices shown (1 of 2)]
[im 1/46]
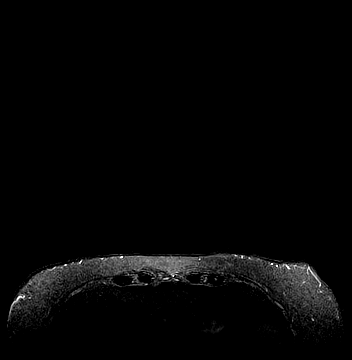
[im 23/46]
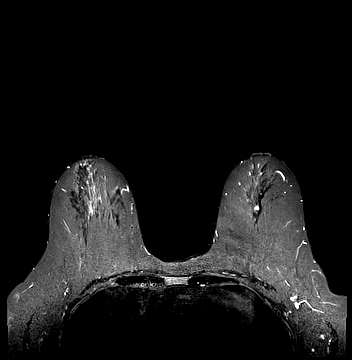
[im 46/46]
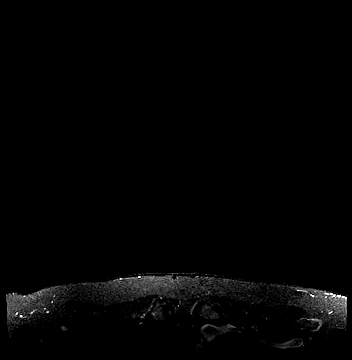

[Series 5: T2 · axial · B · 3.0mm · 1.02mm/px · z∈[-82,+80]mm · 3 of 46 slices shown (2 of 2)]
[im 1/46]
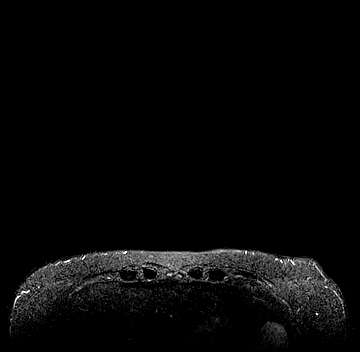
[im 23/46]
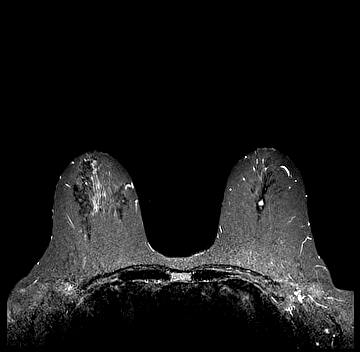
[im 46/46]
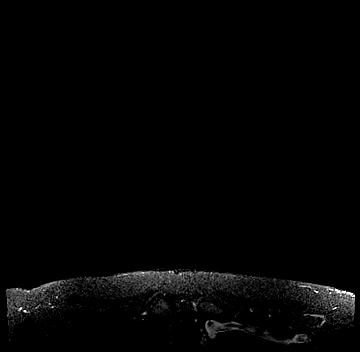

[12 of 48 positions shown; findings below may reference images not displayed]

Three-dimensional MR images were rendered by post-processing of the
original MR data on an independent workstation. The
three-dimensional MR images were interpreted, and findings are
reported in the following complete MRI report for this study. Three
dimensional images were evaluated at the independent interpreting
workstation using the DynaCAD thin client.
FINDINGS: Breast composition: c. Heterogeneous fibroglandular tissue.

Background parenchymal enhancement: Mild.

Right breast: No mass or abnormal enhancement.

Left breast: Postsurgical scarring and small seroma in the 12
o'clock position of the breast centrally. No mass or abnormal
enhancement.

Lymph nodes: No abnormal appearing lymph nodes.

Ancillary findings:  None.
IMPRESSION: No evidence of malignancy.

RECOMMENDATION:
Bilateral screening mammogram in 6 months when due.

BI-RADS CATEGORY  2: Benign.

## 2021-01-23 MED ORDER — GADOBUTROL 1 MMOL/ML IV SOLN
8.0000 mL | Freq: Once | INTRAVENOUS | Status: AC | PRN
  Administered 2021-01-23: 8 mL via INTRAVENOUS

## 2021-05-16 ENCOUNTER — Encounter: Payer: Self-pay | Admitting: Oncology

## 2021-05-16 ENCOUNTER — Inpatient Hospital Stay: Payer: Medicare Other | Attending: Oncology

## 2021-05-16 ENCOUNTER — Other Ambulatory Visit: Payer: Self-pay

## 2021-05-16 ENCOUNTER — Inpatient Hospital Stay (HOSPITAL_BASED_OUTPATIENT_CLINIC_OR_DEPARTMENT_OTHER): Payer: Medicare Other | Admitting: Oncology

## 2021-05-16 VITALS — BP 135/79 | HR 78 | Temp 97.7°F | Resp 16 | Wt 187.5 lb

## 2021-05-16 DIAGNOSIS — Z170 Estrogen receptor positive status [ER+]: Secondary | ICD-10-CM | POA: Insufficient documentation

## 2021-05-16 DIAGNOSIS — Z79899 Other long term (current) drug therapy: Secondary | ICD-10-CM | POA: Diagnosis not present

## 2021-05-16 DIAGNOSIS — Z79810 Long term (current) use of selective estrogen receptor modulators (SERMs): Secondary | ICD-10-CM | POA: Insufficient documentation

## 2021-05-16 DIAGNOSIS — Z9189 Other specified personal risk factors, not elsewhere classified: Secondary | ICD-10-CM

## 2021-05-16 DIAGNOSIS — Z87442 Personal history of urinary calculi: Secondary | ICD-10-CM | POA: Insufficient documentation

## 2021-05-16 DIAGNOSIS — I129 Hypertensive chronic kidney disease with stage 1 through stage 4 chronic kidney disease, or unspecified chronic kidney disease: Secondary | ICD-10-CM | POA: Insufficient documentation

## 2021-05-16 DIAGNOSIS — Z86000 Personal history of in-situ neoplasm of breast: Secondary | ICD-10-CM

## 2021-05-16 DIAGNOSIS — E785 Hyperlipidemia, unspecified: Secondary | ICD-10-CM | POA: Insufficient documentation

## 2021-05-16 DIAGNOSIS — Z9229 Personal history of other drug therapy: Secondary | ICD-10-CM

## 2021-05-16 DIAGNOSIS — K219 Gastro-esophageal reflux disease without esophagitis: Secondary | ICD-10-CM | POA: Diagnosis not present

## 2021-05-16 DIAGNOSIS — D649 Anemia, unspecified: Secondary | ICD-10-CM

## 2021-05-16 DIAGNOSIS — D0502 Lobular carcinoma in situ of left breast: Secondary | ICD-10-CM | POA: Insufficient documentation

## 2021-05-16 DIAGNOSIS — R232 Flushing: Secondary | ICD-10-CM | POA: Diagnosis not present

## 2021-05-16 DIAGNOSIS — N189 Chronic kidney disease, unspecified: Secondary | ICD-10-CM | POA: Insufficient documentation

## 2021-05-16 DIAGNOSIS — Z1239 Encounter for other screening for malignant neoplasm of breast: Secondary | ICD-10-CM | POA: Diagnosis not present

## 2021-05-16 DIAGNOSIS — E039 Hypothyroidism, unspecified: Secondary | ICD-10-CM | POA: Insufficient documentation

## 2021-05-16 DIAGNOSIS — Z1231 Encounter for screening mammogram for malignant neoplasm of breast: Secondary | ICD-10-CM

## 2021-05-16 DIAGNOSIS — D631 Anemia in chronic kidney disease: Secondary | ICD-10-CM | POA: Insufficient documentation

## 2021-05-16 LAB — COMPREHENSIVE METABOLIC PANEL
ALT: 12 U/L (ref 0–44)
AST: 18 U/L (ref 15–41)
Albumin: 3.7 g/dL (ref 3.5–5.0)
Alkaline Phosphatase: 53 U/L (ref 38–126)
Anion gap: 10 (ref 5–15)
BUN: 29 mg/dL — ABNORMAL HIGH (ref 8–23)
CO2: 21 mmol/L — ABNORMAL LOW (ref 22–32)
Calcium: 9.9 mg/dL (ref 8.9–10.3)
Chloride: 108 mmol/L (ref 98–111)
Creatinine, Ser: 1.43 mg/dL — ABNORMAL HIGH (ref 0.44–1.00)
GFR, Estimated: 40 mL/min — ABNORMAL LOW (ref >60)
Glucose, Bld: 135 mg/dL — ABNORMAL HIGH (ref 70–99)
Potassium: 4.1 mmol/L (ref 3.5–5.1)
Sodium: 139 mmol/L (ref 135–145)
Total Bilirubin: 0.4 mg/dL (ref 0.3–1.2)
Total Protein: 7.2 g/dL (ref 6.5–8.1)

## 2021-05-16 LAB — FOLATE: Folate: 17.7 ng/mL (ref >5.9)

## 2021-05-16 LAB — IRON AND TIBC
Iron: 52 ug/dL (ref 28–170)
Saturation Ratios: 14 % (ref 10.4–31.8)
TIBC: 377 ug/dL (ref 250–450)
UIBC: 325 ug/dL

## 2021-05-16 LAB — CBC WITH DIFFERENTIAL/PLATELET
Abs Immature Granulocytes: 0.04 10*3/uL (ref 0.00–0.07)
Basophils Absolute: 0.1 10*3/uL (ref 0.0–0.1)
Basophils Relative: 1 %
Eosinophils Absolute: 0.2 10*3/uL (ref 0.0–0.5)
Eosinophils Relative: 2 %
HCT: 34.1 % — ABNORMAL LOW (ref 36.0–46.0)
Hemoglobin: 11.2 g/dL — ABNORMAL LOW (ref 12.0–15.0)
Immature Granulocytes: 0 %
Lymphocytes Relative: 26 %
Lymphs Abs: 2.4 10*3/uL (ref 0.7–4.0)
MCH: 29.7 pg (ref 26.0–34.0)
MCHC: 32.8 g/dL (ref 30.0–36.0)
MCV: 90.5 fL (ref 80.0–100.0)
Monocytes Absolute: 0.5 10*3/uL (ref 0.1–1.0)
Monocytes Relative: 5 %
Neutro Abs: 6 10*3/uL (ref 1.7–7.7)
Neutrophils Relative %: 66 %
Platelets: 229 10*3/uL (ref 150–400)
RBC: 3.77 MIL/uL — ABNORMAL LOW (ref 3.87–5.11)
RDW: 13.5 % (ref 11.5–15.5)
WBC: 9 10*3/uL (ref 4.0–10.5)
nRBC: 0 % (ref 0.0–0.2)

## 2021-05-16 LAB — FERRITIN: Ferritin: 30 ng/mL (ref 11–307)

## 2021-05-16 MED ORDER — TAMOXIFEN CITRATE 20 MG PO TABS: 20.0000 mg | ORAL_TABLET | Freq: Every day | ORAL | 0 refills | Status: DC

## 2021-05-16 NOTE — Progress Notes (Signed)
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  female with PMH listed below was seen in consultation at the request of  Genoveva Ill, [place]  for evaluation of LCIS, management of endocrinology therapy Previously managed by Dr.Byrnett. recently seen by Dr.Piscoya  07/22/2018 left breast biopsy showed complex sclerosis lesion with atypical lobular hyperplasia, duct ectasia.  08/06/2018 left breast excisional biopsy showed focus of LCIS arising from complex  sclerosis lesion with intraductal papillomas. Calcification associated with CSL and LCIS. cystic papillary aprocrine metaplasia.  Patient was started on Tamoxifen for chemoprevention after surgery. She developed severe hot flash and was started on Effexor 37.5 mg daily, later increased to 37.5 mg twice daily.  He reports increased dose helped her symptoms to some extent however she still constantly bothered by symptoms. She was referred to heme-onc for an opinion if any alternatives can be used. No new complaints. Recent mammogram on 08/03/2019 showed no mammographic evidence of malignancy. September 2019, tamoxifen for prophylaxis INTERVAL HISTORY Aimee Hill is a 70 y.o. female who has above history reviewed by me today presents for follow up visit for management of LCIS.  Problems and complaints are listed below: Patient has been on tamoxifen 20 mg daily.  Overall she tolerates well. No new complaints today.  Review of Systems  Constitutional: Negative for appetite change, chills, fatigue and fever.  HENT:   Negative for hearing loss and voice change.   Eyes: Negative for eye problems.   Respiratory: Negative for chest tightness and cough.   Cardiovascular: Negative for chest pain.  Gastrointestinal: Negative for abdominal distention, abdominal pain and blood in stool.  Endocrine: Positive for hot flashes.  Genitourinary: Negative for difficulty urinating and frequency.   Musculoskeletal: Negative for arthralgias.  Skin: Negative for itching and rash.  Neurological: Negative for extremity weakness.  Hematological: Negative for adenopathy.  Psychiatric/Behavioral: Negative for confusion.    MEDICAL HISTORY:  Past Medical History:  Diagnosis Date  . Complication of anesthesia    TAKES MORE TO SEDATE HER  . Family history of adverse reaction to anesthesia    SISTER N/V  . Fracture 2013   left foot and second and third toes on left foot  . GERD (gastroesophageal reflux disease)   . Headache   . History of kidney stones   . Hyperlipidemia   . Hypertension   . Hypothyroidism   . Osteoarthritis   . PONV (postoperative nausea and vomiting)   . Urinary calculus     SURGICAL HISTORY: Past Surgical History:  Procedure Laterality Date  . BREAST BIOPSY Left 07/22/2018   affirm bx coil marker path pending  . BREAST BIOPSY Left 08/06/2018   Procedure: BREAST BIOPSY;  Surgeon: Robert Bellow, MD;  Location: [facility] ORS;  Service: General;  Laterality: Left;  . BREAST EXCISIONAL BIOPSY Left 08/06/2018   lumpectomy atypical lobular hyperplasia   . BREAST LUMPECTOMY Left 08/10/2018   ADH removed  . COLONOSCOPY  2013   negative/Dr. Kathyrn Sheriff [place]-next one due 2023  . DILATION AND CURETTAGE, DIAGNOSTIC / THERAPEUTIC    . 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 TOOTH EXTRACTION    . [place]  Dr. Rayford Halsted  . TUBAL LIGATION    . URETERAL STENT PLACEMENT  1997    SOCIAL HISTORY: Social History   Socioeconomic History  . Marital status: Married    Spouse name: Not on file  . Number of children: 1  .  Years of education: 10  . Highest education level: Not on file  Occupational History  . Occupation: Network engineer  Tobacco Use  . Smoking status: Never Smoker  . Smokeless tobacco: Never Used  Vaping Use  . Vaping Use: Never used  Substance and Sexual Activity  . Alcohol use: No  . Drug use: No  . Sexual activity: Yes    Partners: Male    Birth control/protection: Surgical  Other Topics Concern  . Not on file  Social History Narrative  . Not on file   Social Determinants of Health   Financial Resource Strain: Not on file  Food Insecurity: Not on file  Transportation Needs: Not on file  Physical Activity: Not on file  Stress: Not on file  Social Connections: Not on file  Intimate Partner Violence: Not on file    FAMILY HISTORY: Family History  Problem Relation Age of Onset  . Alzheimer's disease Mother        died at age 31 from internal bleeding  . Rheum arthritis Mother   . Brain cancer Father 42  . Lymphoma Paternal Aunt 16  . Colon cancer Paternal Aunt   . Breast cancer Neg Hx     ALLERGIES:  is allergic to penicillins.  MEDICATIONS:  Current Outpatient Medications  Medication Sig Dispense Refill  . ASPIRIN 81 PO Take 1 tablet by mouth.    . furosemide (LASIX) 20 MG tablet 3 (three) times a week.     . levothyroxine (SYNTHROID, LEVOTHROID) 50 MCG tablet Take 50 mcg by mouth daily before breakfast.   10  . meloxicam (MOBIC) 15 MG tablet Take 15 mg by mouth daily.    . metaxalone (SKELAXIN) 800 MG tablet TAKE ONE TABLET BY MOUTH THREE TIMES A DAY AS NEEDED FOR PAIN    . nabumetone (RELAFEN) 500 MG tablet Take 500 mg by mouth 2 (two) times daily.     Marland Kitchen omeprazole (PRILOSEC) 20 MG capsule Take 20 mg by mouth 2 (two) times daily.     . pravastatin (PRAVACHOL) 10 MG tablet Take 10 mg by mouth every morning.   10  . valACYclovir (VALTREX) 1000 MG tablet Take 1,000 mg by mouth daily as needed.    . venlafaxine (EFFEXOR) 37.5 MG tablet Take 1 tablet (37.5 mg total) by  mouth 2 (two) times daily. 60 tablet 5  . verapamil (CALAN-SR) 120 MG CR tablet Take 120 mg by mouth every morning.    . zolpidem (AMBIEN) 5 MG tablet Take 5 mg by mouth at bedtime as needed.   5  . gabapentin (NEURONTIN) 100 MG capsule Take 400 mg by mouth every morning.  (Patient not taking: No sig reported)  4  . tamoxifen (NOLVADEX) 20 MG tablet Take 1 tablet (20 mg total) by mouth daily. 90 tablet 0   No current facility-administered medications for this visit.     PHYSICAL EXAMINATION: ECOG PERFORMANCE STATUS: 1 - Symptomatic but completely ambulatory Vitals:   05/16/21 1339  BP: 135/79  Pulse: 78  Resp: 16  Temp: 97.7 F (36.5 C)   Filed Weights   05/16/21 1339  Weight: 187 lb 8 oz (85 kg)    Physical Exam Constitutional:  General: She is not in acute distress. HENT:     Head: Normocephalic and atraumatic.  Eyes:     General: No scleral icterus.    Pupils: Pupils are equal, round, and reactive to light.  Cardiovascular:     Rate and Rhythm: Normal rate and regular rhythm.     Heart sounds: Normal heart sounds.  Pulmonary:     Effort: Pulmonary effort is normal. No respiratory distress.     Breath sounds: No wheezing.  Abdominal:     General: Bowel sounds are normal. There is no distension.     Palpations: Abdomen is soft. There is no mass.     Tenderness: There is no abdominal tenderness.  Musculoskeletal:        General: No deformity. Normal range of motion.     Cervical back: Normal range of motion and neck supple.  Skin:    General: Skin is warm and dry.     Findings: No erythema or rash.  Neurological:     Mental Status: She is alert and oriented to person, place, and time.     Cranial Nerves: No cranial nerve deficit.     Coordination: Coordination normal.  Psychiatric:        Behavior: Behavior normal.        Thought Content: Thought content normal.    LABORATORY DATA:  I have reviewed the data as listed CBC Latest Ref Rng & Units 05/16/2021  12/26/2020  WBC 4.0 - 10.5 K/uL 9.0 7.8  Hemoglobin 12.0 - 15.0 g/dL 11.2(L) 11.0(L)  Hematocrit 36.0 - 46.0 % 34.1(L) 33.5(L)  Platelets 150 - 400 K/uL 229 213      RADIOGRAPHIC STUDIES: I have personally reviewed the radiological images as listed and agreed with the findings in the report. No results found.   ASSESSMENT & PLAN:  1. 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 At increased risk of breast cancer   3. Breast cancer screening, high risk patient   4. Encounter for screening mammogram for breast cancer   5. Normocytic anemia    LCIS, currently on chemoprevention with tamoxifen.-History of hysterectomy Continue tamoxifen 20 mg daily.  Continue Effexor 37.5 mg twice daily. Discussed with patient about low-dose aspirin 81 mg for DVT prophylaxis.  She agrees and will start today.  #History of hormone replacement.  Personal history of LCIS. Tyrer Cuzick risk model -lifetime risk 38.7%, personal 10-year risk 25% Annual diagnostic mammogram alternate with MRI.  I will obtain bilateral screening mammogram in August 2022  #Chronic kidney disease.  Multiple myeloma panel did not reveal any M protein.  Normal free light chain ratio. Avoid nephrotoxin.  Recommend patient to further discuss with primary care provider for management.  #Anemia, mild.  Folate is 17.7, B12 is pending.  Normal iron panel.  Continue to monitor this is likely due to chronic kidney disease. All questions were answered. The patient knows to call the clinic with any problems questions or concerns.  cc Genoveva Ill, FNP    Return of visit: 6 months Earlie Server, MD, PhD Hematology Oncology [facility] Pager- [phone] 05/16/2021

## 2021-05-16 NOTE — Progress Notes (Signed)
Patient denies new problems/concerns today.   °

## 2021-05-17 LAB — VITAMIN B12: Vitamin B-12: 157 pg/mL — ABNORMAL LOW (ref 180–914)

## 2021-05-18 ENCOUNTER — Telehealth: Payer: Self-pay

## 2021-05-18 ENCOUNTER — Inpatient Hospital Stay: Payer: Medicare Other | Admitting: Oncology

## 2021-05-18 ENCOUNTER — Inpatient Hospital Stay: Payer: Medicare Other

## 2021-05-18 DIAGNOSIS — Z86000 Personal history of in-situ neoplasm of breast: Secondary | ICD-10-CM

## 2021-05-18 DIAGNOSIS — E538 Deficiency of other specified B group vitamins: Secondary | ICD-10-CM

## 2021-05-18 NOTE — Telephone Encounter (Signed)
Patient has viewed MyChart message.

## 2021-05-18 NOTE — Telephone Encounter (Signed)
-----   Message from Earlie Server, MD sent at 05/18/2021  8:45 AM EDT ----- Please let her know that her b12 level is low. Advise oral b12 106mcg daily, she can buy otc.  Add B12 level to next visit.

## 2021-05-18 NOTE — Telephone Encounter (Signed)
MyChart message sent to patient notifying her of MD recommendation.

## 2021-06-26 ENCOUNTER — Other Ambulatory Visit: Payer: Medicare Other

## 2021-06-26 ENCOUNTER — Ambulatory Visit: Payer: Medicare Other | Admitting: Oncology

## 2021-08-20 ENCOUNTER — Ambulatory Visit
Admission: RE | Admit: 2021-08-20 | Discharge: 2021-08-20 | Disposition: A | Discharge status: Home / Self Care | Payer: Medicare Other | Source: Ambulatory Visit | Attending: Oncology | Admitting: Oncology

## 2021-08-20 ENCOUNTER — Other Ambulatory Visit: Payer: Self-pay

## 2021-08-20 DIAGNOSIS — Z1239 Encounter for other screening for malignant neoplasm of breast: Secondary | ICD-10-CM

## 2021-08-20 DIAGNOSIS — Z1231 Encounter for screening mammogram for malignant neoplasm of breast: Secondary | ICD-10-CM | POA: Diagnosis not present

## 2021-08-20 IMAGING — MG MM DIGITAL SCREENING BILAT W/ TOMO AND CAD
6 of 10 series · 6 of 30 positions shown · non-contrast
Comparison: Previous exam(s).

CLINICAL DATA: Screening.

EXAM:
DIGITAL SCREENING BILATERAL MAMMOGRAM WITH TOMOSYNTHESIS AND CAD
TECHNIQUE: Bilateral screening digital craniocaudal and mediolateral oblique
mammograms were obtained. Bilateral screening digital breast
tomosynthesis was performed. The images were evaluated with
computer-aided detection.

[L MLO synth-2D (1 of 2)]
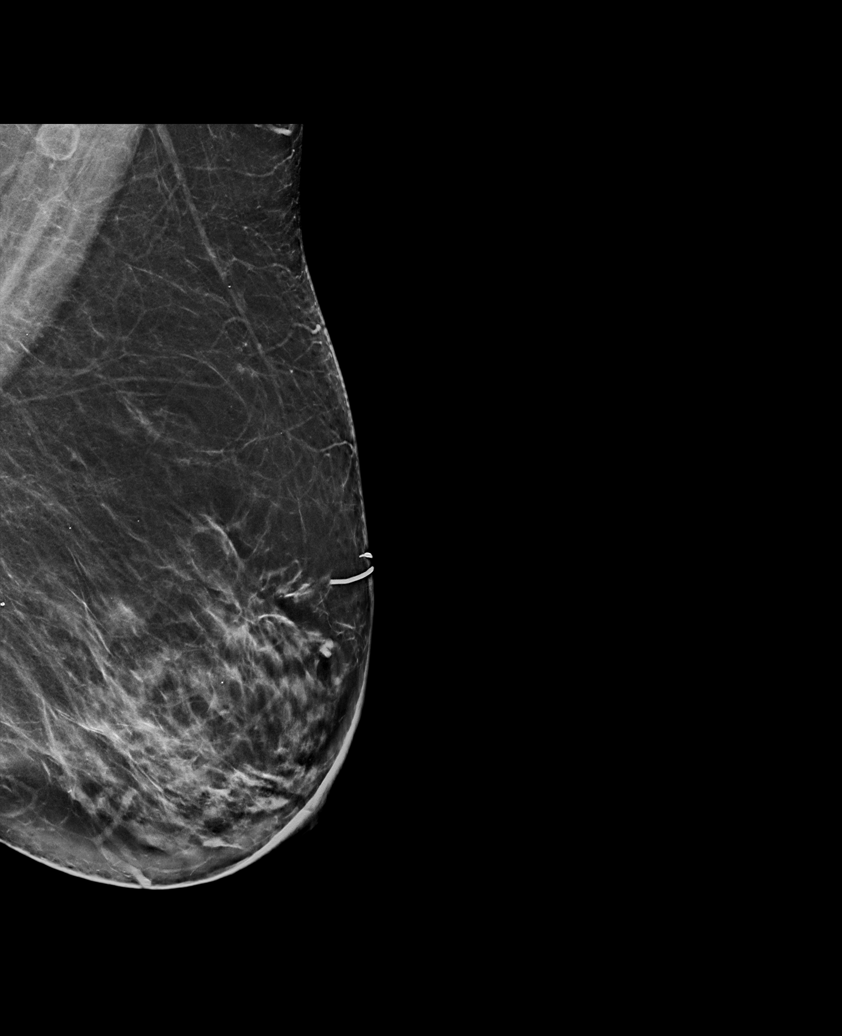

[R MLO synth-2D]
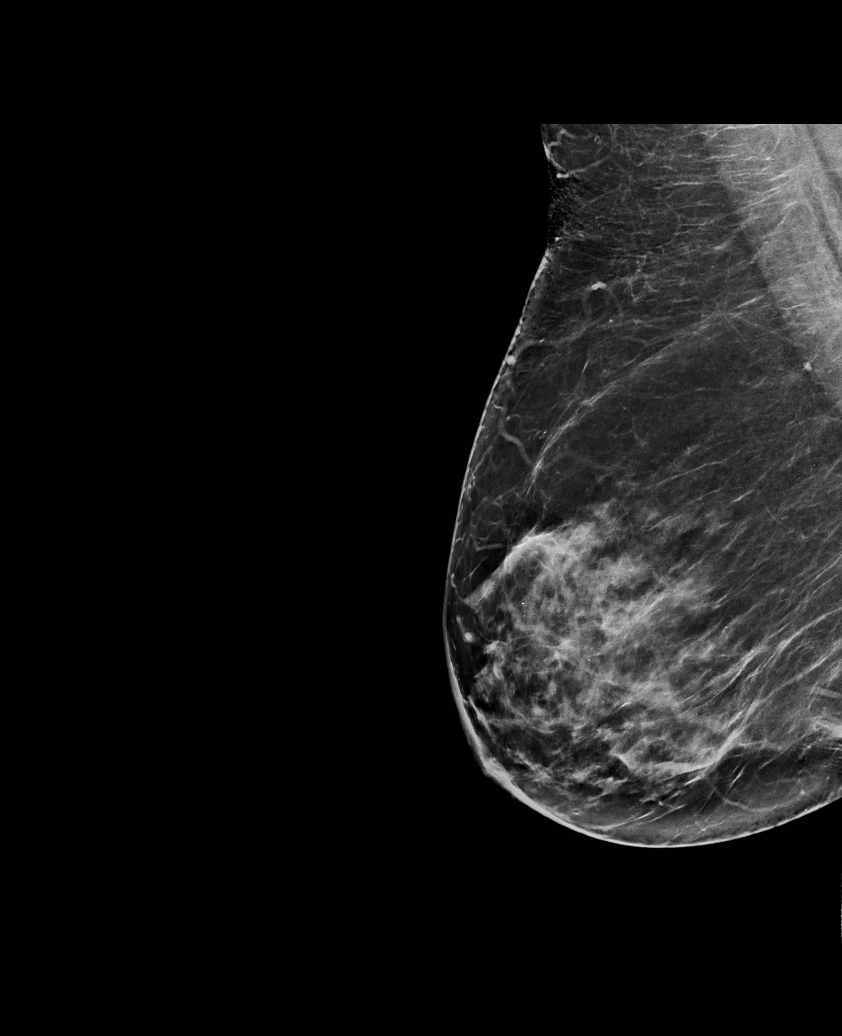

[L MLO synth-2D (2 of 2)]
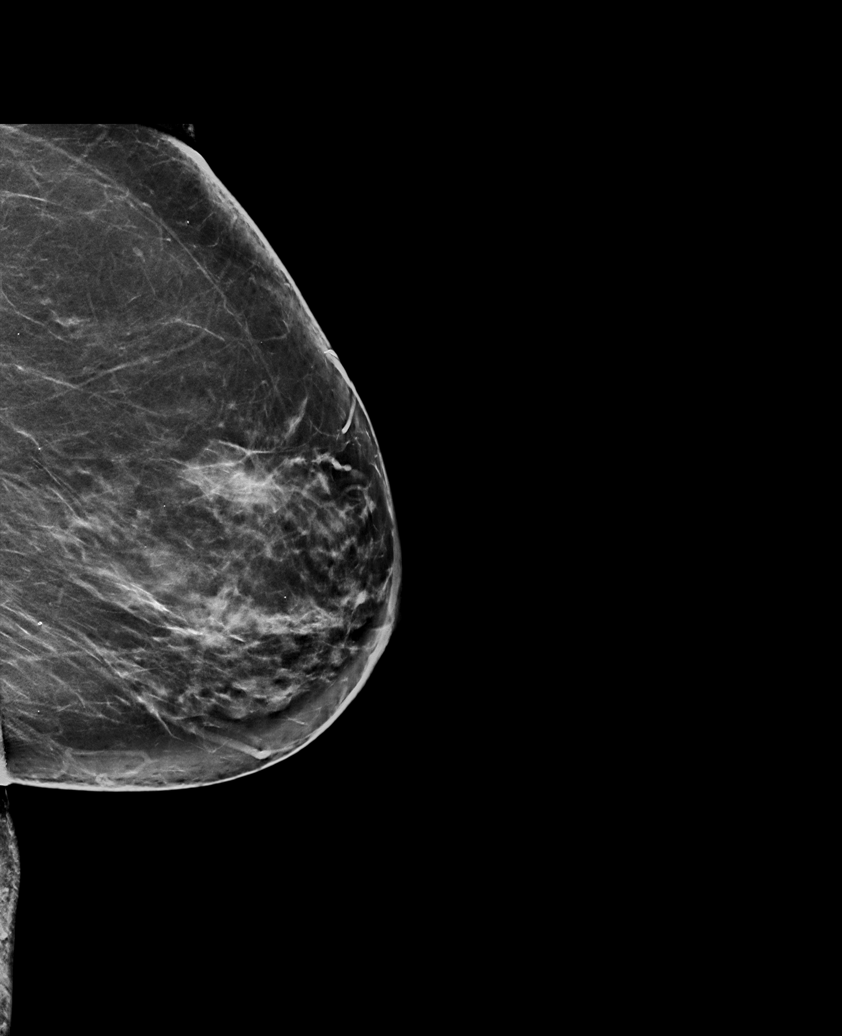

[R CC synth-2D]
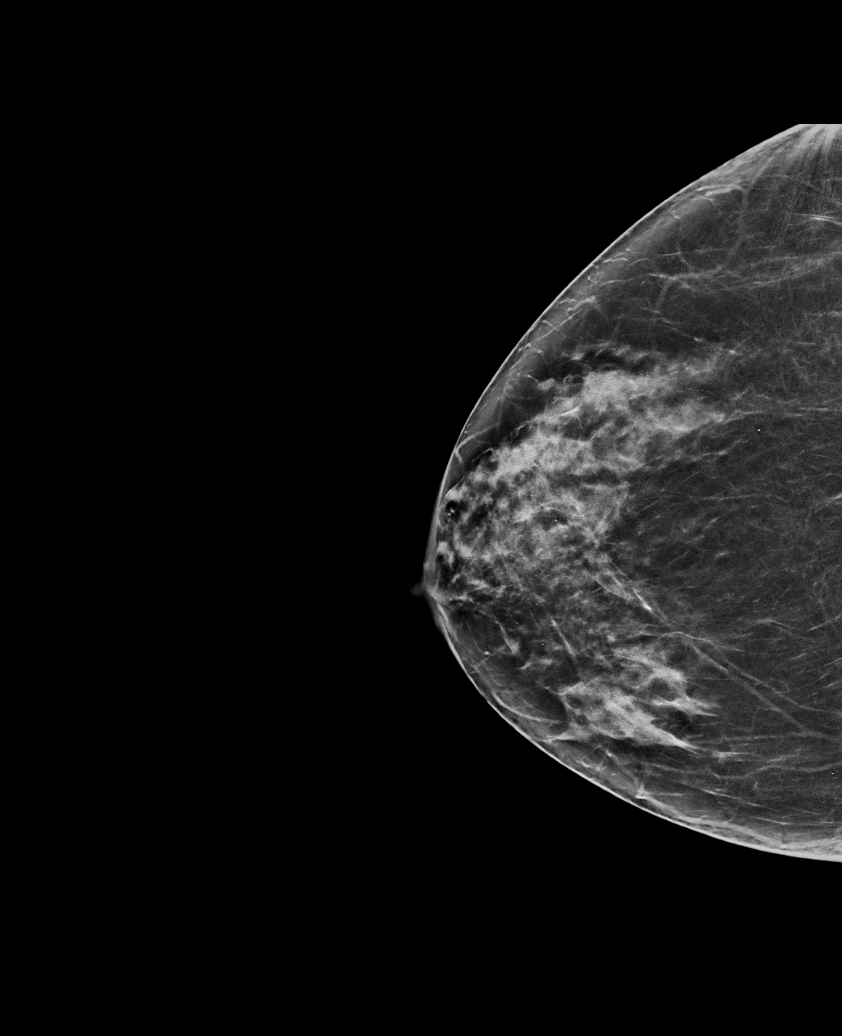

[L CC synth-2D]
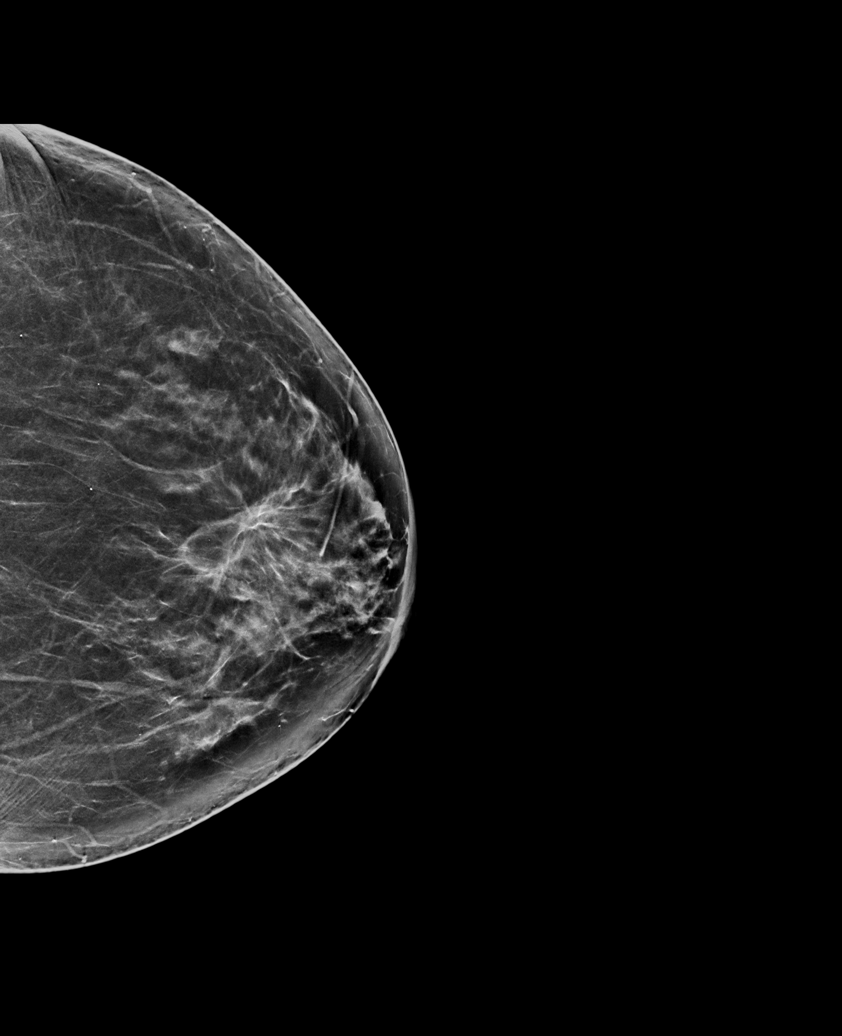

[R CC tomo · tomo slice 35/69.0]
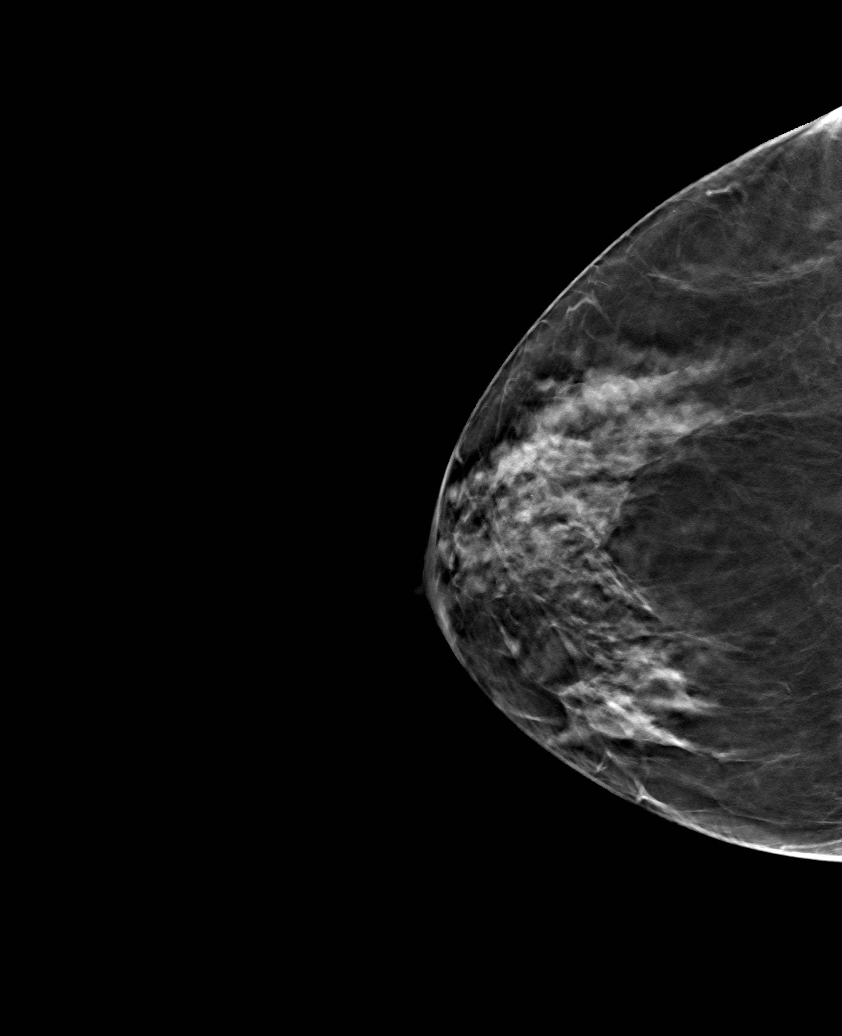

[6 of 30 positions shown; findings below may reference images not displayed]

ACR Breast Density Category c: The breast tissue is heterogeneously
dense, which may obscure small masses.
FINDINGS: There are no findings suspicious for malignancy.
IMPRESSION: No mammographic evidence of malignancy. A result letter of this
screening mammogram will be mailed directly to the patient.

RECOMMENDATION:
Screening mammogram in one year. (Code:[V2])

BI-RADS CATEGORY  1: Negative.

## 2021-10-27 ENCOUNTER — Other Ambulatory Visit: Payer: Self-pay | Admitting: Oncology

## 2021-11-07 ENCOUNTER — Inpatient Hospital Stay (HOSPITAL_BASED_OUTPATIENT_CLINIC_OR_DEPARTMENT_OTHER): Payer: Medicare Other | Admitting: Oncology

## 2021-11-07 ENCOUNTER — Inpatient Hospital Stay: Payer: Medicare Other | Attending: Oncology

## 2021-11-07 ENCOUNTER — Other Ambulatory Visit: Payer: Self-pay

## 2021-11-07 ENCOUNTER — Encounter: Payer: Self-pay | Admitting: Oncology

## 2021-11-07 VITALS — BP 148/69 | HR 75 | Temp 98.1°F | Resp 18 | Wt 185.7 lb

## 2021-11-07 DIAGNOSIS — Z1239 Encounter for other screening for malignant neoplasm of breast: Secondary | ICD-10-CM

## 2021-11-07 DIAGNOSIS — D0502 Lobular carcinoma in situ of left breast: Secondary | ICD-10-CM | POA: Insufficient documentation

## 2021-11-07 DIAGNOSIS — N1832 Chronic kidney disease, stage 3b: Secondary | ICD-10-CM | POA: Insufficient documentation

## 2021-11-07 DIAGNOSIS — Z791 Long term (current) use of non-steroidal anti-inflammatories (NSAID): Secondary | ICD-10-CM | POA: Insufficient documentation

## 2021-11-07 DIAGNOSIS — I129 Hypertensive chronic kidney disease with stage 1 through stage 4 chronic kidney disease, or unspecified chronic kidney disease: Secondary | ICD-10-CM | POA: Insufficient documentation

## 2021-11-07 DIAGNOSIS — E039 Hypothyroidism, unspecified: Secondary | ICD-10-CM | POA: Insufficient documentation

## 2021-11-07 DIAGNOSIS — Z9229 Personal history of other drug therapy: Secondary | ICD-10-CM | POA: Diagnosis not present

## 2021-11-07 DIAGNOSIS — Z87442 Personal history of urinary calculi: Secondary | ICD-10-CM | POA: Diagnosis not present

## 2021-11-07 DIAGNOSIS — K219 Gastro-esophageal reflux disease without esophagitis: Secondary | ICD-10-CM | POA: Insufficient documentation

## 2021-11-07 DIAGNOSIS — Z800 Family history of malignant neoplasm of digestive organs: Secondary | ICD-10-CM | POA: Insufficient documentation

## 2021-11-07 DIAGNOSIS — E785 Hyperlipidemia, unspecified: Secondary | ICD-10-CM | POA: Diagnosis not present

## 2021-11-07 DIAGNOSIS — D649 Anemia, unspecified: Secondary | ICD-10-CM

## 2021-11-07 DIAGNOSIS — Z79890 Hormone replacement therapy (postmenopausal): Secondary | ICD-10-CM | POA: Insufficient documentation

## 2021-11-07 DIAGNOSIS — E538 Deficiency of other specified B group vitamins: Secondary | ICD-10-CM | POA: Insufficient documentation

## 2021-11-07 DIAGNOSIS — D0500 Lobular carcinoma in situ of unspecified breast: Secondary | ICD-10-CM | POA: Diagnosis not present

## 2021-11-07 DIAGNOSIS — R232 Flushing: Secondary | ICD-10-CM | POA: Insufficient documentation

## 2021-11-07 DIAGNOSIS — Z79810 Long term (current) use of selective estrogen receptor modulators (SERMs): Secondary | ICD-10-CM | POA: Insufficient documentation

## 2021-11-07 DIAGNOSIS — Z803 Family history of malignant neoplasm of breast: Secondary | ICD-10-CM | POA: Insufficient documentation

## 2021-11-07 DIAGNOSIS — M1990 Unspecified osteoarthritis, unspecified site: Secondary | ICD-10-CM | POA: Diagnosis not present

## 2021-11-07 DIAGNOSIS — Z86000 Personal history of in-situ neoplasm of breast: Secondary | ICD-10-CM

## 2021-11-07 DIAGNOSIS — Z170 Estrogen receptor positive status [ER+]: Secondary | ICD-10-CM | POA: Diagnosis not present

## 2021-11-07 LAB — COMPREHENSIVE METABOLIC PANEL
ALT: 12 U/L (ref 0–44)
AST: 21 U/L (ref 15–41)
Albumin: 3.6 g/dL (ref 3.5–5.0)
Alkaline Phosphatase: 63 U/L (ref 38–126)
Anion gap: 9 (ref 5–15)
BUN: 30 mg/dL — ABNORMAL HIGH (ref 8–23)
CO2: 21 mmol/L — ABNORMAL LOW (ref 22–32)
Calcium: 9.7 mg/dL (ref 8.9–10.3)
Chloride: 107 mmol/L (ref 98–111)
Creatinine, Ser: 1.41 mg/dL — ABNORMAL HIGH (ref 0.44–1.00)
GFR, Estimated: 41 mL/min — ABNORMAL LOW (ref >60)
Glucose, Bld: 115 mg/dL — ABNORMAL HIGH (ref 70–99)
Potassium: 4.3 mmol/L (ref 3.5–5.1)
Sodium: 137 mmol/L (ref 135–145)
Total Bilirubin: 0.2 mg/dL — ABNORMAL LOW (ref 0.3–1.2)
Total Protein: 7.3 g/dL (ref 6.5–8.1)

## 2021-11-07 LAB — CBC WITH DIFFERENTIAL/PLATELET
Abs Immature Granulocytes: 0.03 10*3/uL (ref 0.00–0.07)
Basophils Absolute: 0 10*3/uL (ref 0.0–0.1)
Basophils Relative: 1 %
Eosinophils Absolute: 0.1 10*3/uL (ref 0.0–0.5)
Eosinophils Relative: 2 %
HCT: 36.3 % (ref 36.0–46.0)
Hemoglobin: 11.6 g/dL — ABNORMAL LOW (ref 12.0–15.0)
Immature Granulocytes: 0 %
Lymphocytes Relative: 31 %
Lymphs Abs: 2.2 10*3/uL (ref 0.7–4.0)
MCH: 29.3 pg (ref 26.0–34.0)
MCHC: 32 g/dL (ref 30.0–36.0)
MCV: 91.7 fL (ref 80.0–100.0)
Monocytes Absolute: 0.4 10*3/uL (ref 0.1–1.0)
Monocytes Relative: 6 %
Neutro Abs: 4.3 10*3/uL (ref 1.7–7.7)
Neutrophils Relative %: 60 %
Platelets: 212 10*3/uL (ref 150–400)
RBC: 3.96 MIL/uL (ref 3.87–5.11)
RDW: 13.3 % (ref 11.5–15.5)
WBC: 7.1 10*3/uL (ref 4.0–10.5)
nRBC: 0 % (ref 0.0–0.2)

## 2021-11-07 LAB — IRON AND TIBC
Iron: 57 ug/dL (ref 28–170)
Saturation Ratios: 15 % (ref 10.4–31.8)
TIBC: 389 ug/dL (ref 250–450)
UIBC: 332 ug/dL

## 2021-11-07 LAB — FERRITIN: Ferritin: 30 ng/mL (ref 11–307)

## 2021-11-07 LAB — VITAMIN B12: Vitamin B-12: 380 pg/mL (ref 180–914)

## 2021-11-07 MED ORDER — TAMOXIFEN CITRATE 20 MG PO TABS: 20.0000 mg | ORAL_TABLET | Freq: Every day | ORAL | 1 refills | Status: DC

## 2021-11-07 MED ORDER — VENLAFAXINE HCL 37.5 MG PO TABS: 37.5000 mg | ORAL_TABLET | Freq: Two times a day (BID) | ORAL | 5 refills | Status: DC

## 2021-11-07 NOTE — Progress Notes (Signed)
Hematology/Oncology follow up  note [phone]) [phone] [phone]   Patient Care Team: Genoveva Ill, [place] as PCP - General (Family Medicine) Earlie Server, MD as Consulting Physician (Hematology and Oncology)  REFERRING PROVIDER: Genoveva Ill, FNP  CHIEF COMPLAINTS/REASON FOR VISIT:  Evaluation of LCIS, management of endocrinology therapy  HISTORY OF PRESENTING ILLNESS:   Aimee Hill is a  70 y.o.  female with PMH listed below was seen in consultation at the request of  Genoveva Ill, [place]  for evaluation of LCIS, management of endocrinology therapy Previously managed by Dr.Byrnett. recently seen by Dr.Piscoya  07/22/2018 left breast biopsy showed complex sclerosis lesion with atypical lobular hyperplasia, duct ectasia.  08/06/2018 left breast excisional biopsy showed focus of LCIS arising from complex  sclerosis lesion with intraductal papillomas. Calcification associated with CSL and LCIS. cystic papillary aprocrine metaplasia.  Patient was started on Tamoxifen for chemoprevention after surgery. She developed severe hot flash and was started on Effexor 37.5 mg daily, later increased to 37.5 mg twice daily.  He reports increased dose helped her symptoms to some extent however she still constantly bothered by symptoms. She was referred to heme-onc for an opinion if any alternatives can be used. No new complaints. Recent mammogram on 08/03/2019 showed no mammographic evidence of malignancy. September 2019, tamoxifen for prophylaxis INTERVAL HISTORY Aimee Hill is a 70 y.o. female who has above history reviewed by me today presents for follow up visit for management of LCIS.  Problems and complaints are listed below: Patient has been on tamoxifen 20 mg daily.  She tolerates well.  Patient has intermittent hot flash.  She takes Effexor. No new complaints today.  Review of Systems  Constitutional:  Negative for appetite change, chills, fatigue and fever.  HENT:    Negative for hearing loss and voice change.   Eyes:  Negative for eye problems.  Respiratory:  Negative for chest tightness and cough.   Cardiovascular:  Negative for chest pain.  Gastrointestinal:  Negative for abdominal distention, abdominal pain and blood in stool.  Endocrine: Positive for hot flashes.  Genitourinary:  Negative for difficulty urinating and frequency.   Musculoskeletal:  Negative for arthralgias.  Skin:  Negative for itching and rash.  Neurological:  Negative for extremity weakness.  Hematological:  Negative for adenopathy.  Psychiatric/Behavioral:  Negative for confusion.    MEDICAL HISTORY:  Past Medical History:  Diagnosis Date   Complication of anesthesia    TAKES MORE TO SEDATE HER   Family history of adverse reaction to anesthesia    SISTER N/V   Fracture 2013   left foot and second and third toes on left foot   GERD (gastroesophageal reflux disease)    Headache    History of kidney stones    Hyperlipidemia    Hypertension    Hypothyroidism    Osteoarthritis    PONV (postoperative nausea and vomiting)    Urinary calculus     SURGICAL HISTORY: Past Surgical History:  Procedure Laterality Date   BREAST BIOPSY Left 07/22/2018   affirm bx coil marker path pending   BREAST BIOPSY Left 08/06/2018   Procedure: BREAST BIOPSY;  Surgeon: Robert Bellow, MD;  Location: [facility] ORS;  Service: General;  Laterality: Left;   BREAST EXCISIONAL BIOPSY Left 08/06/2018   lumpectomy atypical lobular hyperplasia    BREAST LUMPECTOMY Left 08/10/2018   ADH removed   COLONOSCOPY  2013   negative/Dr. 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 Rayford Halsted   TUBAL LIGATION     URETERAL STENT PLACEMENT  1997    SOCIAL HISTORY: Social History   Socioeconomic History   Marital status:  Married    Spouse name: Not on file   Number of children: 1   Years of education: 14   Highest education level: Not on file  Occupational History   Occupation: Network engineer  Tobacco Use   Smoking status: Never   Smokeless tobacco: Never  Vaping Use   Vaping Use: Never used  Substance and Sexual Activity   Alcohol use: No   Drug use: No   Sexual activity: Yes    Partners: Male    Birth control/protection: Surgical  Other Topics Concern   Not on file  Social History Narrative   Not on file   Social Determinants of Health   Financial Resource Strain: Not on file  Food Insecurity: Not on file  Transportation Needs: Not on file  Physical Activity: Not on file  Stress: Not on file  Social Connections: Not on file  Intimate Partner Violence: Not on file    FAMILY HISTORY: Family History  Problem Relation Age of Onset   Alzheimer's disease Mother        died at age 66 from internal bleeding   Rheum arthritis Mother    Brain cancer Father 55   Lymphoma Paternal Aunt 40   Colon cancer Paternal Aunt    Breast cancer Neg Hx     ALLERGIES:  is allergic to penicillins.  MEDICATIONS:  Current Outpatient Medications  Medication Sig Dispense Refill   ASPIRIN 81 PO Take 1 tablet by mouth.     furosemide (LASIX) 20 MG tablet 3 (three) times a week.      levothyroxine (SYNTHROID, LEVOTHROID) 50 MCG tablet Take 50 mcg by mouth daily before breakfast.   10   meloxicam (MOBIC) 15 MG tablet Take 15 mg by mouth daily.     nabumetone (RELAFEN) 500 MG tablet Take 500 mg by mouth 2 (two) times daily.      omeprazole (PRILOSEC) 20 MG capsule Take 20 mg by mouth 2 (two) times daily.      pravastatin (PRAVACHOL) 10 MG tablet Take 10 mg by mouth every morning.   10   tamoxifen (NOLVADEX) 20 MG tablet Take 1 tablet (20 mg total) by mouth daily. 90 tablet 0   valACYclovir (VALTREX) 1000 MG tablet Take 1,000 mg by mouth daily as needed.     venlafaxine (EFFEXOR) 37.5 MG tablet Take 1 tablet  (37.5 mg total) by mouth 2 (two) times daily. 60 tablet 0   verapamil (CALAN-SR) 120 MG CR tablet Take 120 mg by mouth every morning.     zolpidem (AMBIEN) 5 MG tablet Take 5 mg by mouth at bedtime as needed.   5   No current facility-administered medications for this visit.     PHYSICAL EXAMINATION: ECOG PERFORMANCE STATUS: 1 - Symptomatic but completely ambulatory Vitals:   11/07/21 1102  BP: (!) 148/69  Pulse: 75  Resp: 18  Temp: 98.1 F (36.7 C)   Filed Weights   11/07/21 1102  Weight: 185 lb 11.2 oz (84.2 kg)    Physical Exam Constitutional:      General: She is not in acute distress. HENT:     Head: Normocephalic and atraumatic.  Eyes:  General: No scleral icterus.    Pupils: Pupils are equal, round, and reactive to light.  Cardiovascular:     Rate and Rhythm: Normal rate and regular rhythm.     Heart sounds: Normal heart sounds.  Pulmonary:     Effort: Pulmonary effort is normal. No respiratory distress.     Breath sounds: No wheezing.  Abdominal:     General: Bowel sounds are normal. There is no distension.     Palpations: Abdomen is soft. There is no mass.     Tenderness: There is no abdominal tenderness.  Musculoskeletal:        General: No deformity. Normal range of motion.     Cervical back: Normal range of motion and neck supple.  Skin:    General: Skin is warm and dry.     Findings: No erythema or rash.  Neurological:     Mental Status: She is alert and oriented to person, place, and time.     Cranial Nerves: No cranial nerve deficit.     Coordination: Coordination normal.  Psychiatric:        Behavior: Behavior normal.        Thought Content: Thought content normal.   LABORATORY DATA:  I have reviewed the data as listed CBC Latest Ref Rng & Units 11/07/2021 05/16/2021 12/26/2020  WBC 4.0 - 10.5 K/uL 7.1 9.0 7.8  Hemoglobin 12.0 - 15.0 g/dL 11.6(L) 11.2(L) 11.0(L)  Hematocrit 36.0 - 46.0 % 36.3 34.1(L) 33.5(L)  Platelets 150 - 400 K/uL 212  229 213      RADIOGRAPHIC STUDIES: I have personally reviewed the radiological images as listed and agreed with the findings in the report. MM 3D SCREEN BREAST BILATERAL  Result Date: 08/24/2021 CLINICAL DATA:  Screening. EXAM: DIGITAL SCREENING BILATERAL MAMMOGRAM WITH TOMOSYNTHESIS AND CAD TECHNIQUE: Bilateral screening digital craniocaudal and mediolateral oblique mammograms were obtained. Bilateral screening digital breast tomosynthesis was performed. The images were evaluated with computer-aided detection. COMPARISON:  Previous exam(s). ACR Breast Density Category c: The breast tissue is heterogeneously dense, which may obscure small masses. FINDINGS: There are no findings suspicious for malignancy. IMPRESSION: No mammographic evidence of malignancy. A result letter of this screening mammogram will be mailed directly to the patient. RECOMMENDATION: Screening mammogram in one year. (Code:SM-B-01Y) BI-RADS CATEGORY  1: Negative. Electronically Signed   By: Kristopher Oppenheim M.D.   On: 08/24/2021 13:47    ASSESSMENT & PLAN:  1. 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 Vitamin B12 deficiency   3. History of hormone replacement therapy   4. Breast cancer screening, high risk patient   5. Stage 3b chronic kidney disease (HCC)    LCIS, currently on chemoprevention with tamoxifen.-History of hysterectomy Labs reviewed and are discussed with patient. Continue tamoxifen 20 mg daily.  Continue Effexor 37.5 mg twice daily. Continue aspirin 81 mg for DVT prophylaxis.  #History of hormone replacement.  Personal history of LCIS. Tyrer Cuzick risk model -lifetime risk 38.7%, personal 10-year risk 25% Annual mammogram results were reviewed and discussed with patient. I will order annual MRI bilateral breast.-February 2023  #Chronic kidney disease.  Multiple myeloma panel did not reveal any M protein.  Normal free light chain ratio. Avoid nephrotoxin.   Creatinine stable.     #Vitamin B12 deficiency, vitamin B12 is 380, improved.  Continue vitamin B-12 1000 MCG daily. Iron panels are stable.  All questions were answered. The patient knows to call the clinic with any problems questions or concerns.  cc Genoveva Ill, FNP    Return of visit: 6 months Earlie Server, MD, PhD  11/07/2021

## 2021-11-07 NOTE — Progress Notes (Signed)
Pt here for follow up. No new concerns voiced.   

## 2022-02-11 ENCOUNTER — Ambulatory Visit
Admission: RE | Admit: 2022-02-11 | Discharge: 2022-02-11 | Disposition: A | Discharge status: Home / Self Care | Payer: Medicare Other | Source: Ambulatory Visit | Attending: Oncology | Admitting: Oncology

## 2022-02-11 ENCOUNTER — Other Ambulatory Visit: Payer: Self-pay

## 2022-02-11 DIAGNOSIS — D0500 Lobular carcinoma in situ of unspecified breast: Secondary | ICD-10-CM | POA: Insufficient documentation

## 2022-02-11 IMAGING — MR MR BREAST BILAT WO/W CM
3 of 10 series · 12 of 48 positions shown · IV contrast (8ml Gadavist)
Comparison: MRI breast [DATE]

CLINICAL DATA: Patient status post left breast excision in [AG]
demonstrating LCIS arising in a complex sclerosing lesion and
intraductal papillomas. Follow-up exam.

EXAM:
BILATERAL BREAST MRI WITH AND WITHOUT CONTRAST
TECHNIQUE: Multiplanar, multisequence MR images of both breasts were obtained
prior to and following the intravenous administration of 8 ml of
Gadavist

[Series 2: T1 · axial · B · 1.5mm · 1.02mm/px · z∈[-88,+78]mm · 6 of 112 slices shown]
[im 1/112]
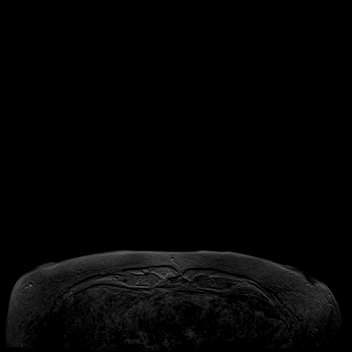
[im 23/112]
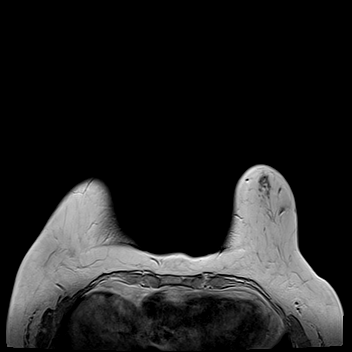
[im 45/112]
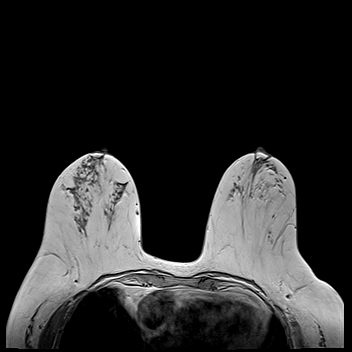
[im 67/112]
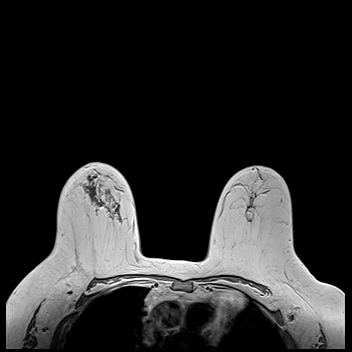
[im 89/112]
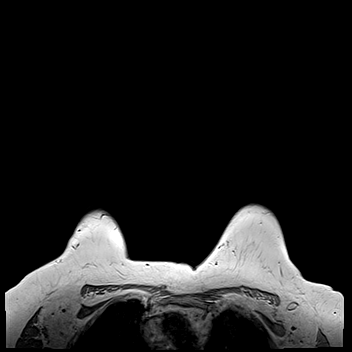
[im 112/112]
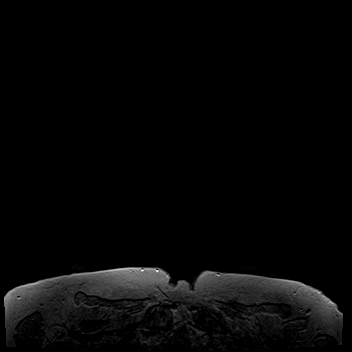

[Series 3: T2 · axial · B · 3.0mm · 1.02mm/px · z∈[-86,+76]mm · 3 of 46 slices shown (1 of 2)]
[im 1/46]
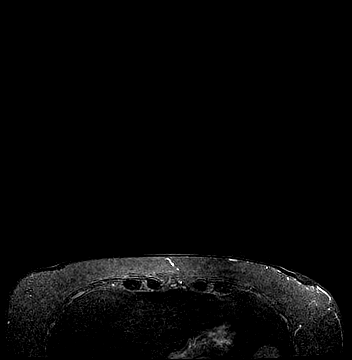
[im 23/46]
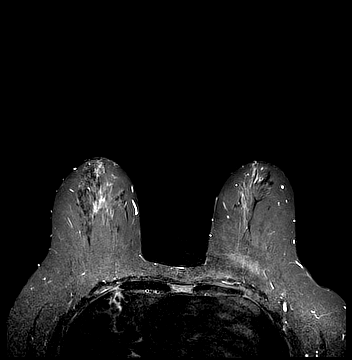
[im 46/46]
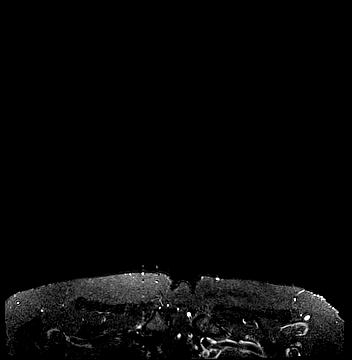

[Series 5: T2 · axial · B · 3.0mm · 1.02mm/px · z∈[-86,+76]mm · 3 of 46 slices shown (2 of 2)]
[im 1/46]
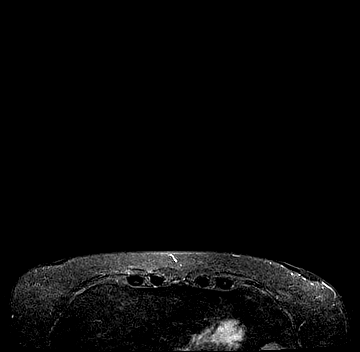
[im 23/46]
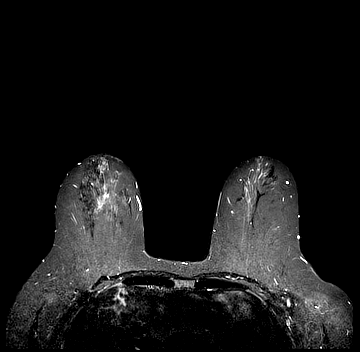
[im 46/46]
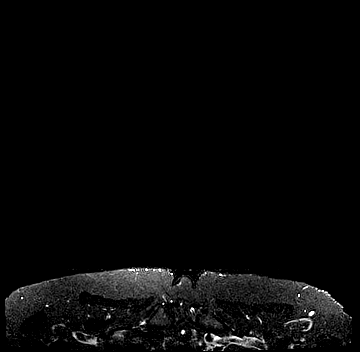

[12 of 48 positions shown; findings below may reference images not displayed]

Three-dimensional MR images were rendered by post-processing of the
original MR data on an independent workstation. The
three-dimensional MR images were interpreted, and findings are
reported in the following complete MRI report for this study. Three
dimensional images were evaluated at the independent interpreting
workstation using the DynaCAD thin client.
FINDINGS: Breast composition: b. Scattered fibroglandular tissue.

Background parenchymal enhancement: Minimal

Right breast: No mass or abnormal enhancement.

Left breast: No mass or abnormal enhancement. Stable postsurgical
changes within the left breast centrally.

Lymph nodes: No abnormal appearing lymph nodes.

Ancillary findings:  None.
IMPRESSION: No MRI evidence to suggest malignancy.

RECOMMENDATION:
Screening mammography [DATE].

BI-RADS CATEGORY  2: Benign.

## 2022-02-11 MED ORDER — GADOBUTROL 1 MMOL/ML IV SOLN
8.0000 mL | Freq: Once | INTRAVENOUS | Status: AC | PRN
  Administered 2022-02-11: 8 mL via INTRAVENOUS

## 2022-02-15 ENCOUNTER — Encounter: Payer: Self-pay | Admitting: Oncology

## 2022-02-15 NOTE — Telephone Encounter (Signed)
Please advise 

## 2022-04-24 ENCOUNTER — Other Ambulatory Visit: Payer: Self-pay | Admitting: Oncology

## 2022-05-08 ENCOUNTER — Inpatient Hospital Stay (HOSPITAL_BASED_OUTPATIENT_CLINIC_OR_DEPARTMENT_OTHER): Payer: Medicare Other | Admitting: Oncology

## 2022-05-08 ENCOUNTER — Ambulatory Visit: Payer: Medicare Other | Admitting: Internal Medicine

## 2022-05-08 ENCOUNTER — Inpatient Hospital Stay: Payer: Medicare Other | Attending: Oncology

## 2022-05-08 ENCOUNTER — Encounter: Payer: Self-pay | Admitting: Oncology

## 2022-05-08 VITALS — BP 122/62 | HR 83 | Resp 18 | Wt 179.0 lb

## 2022-05-08 DIAGNOSIS — E538 Deficiency of other specified B group vitamins: Secondary | ICD-10-CM | POA: Insufficient documentation

## 2022-05-08 DIAGNOSIS — K219 Gastro-esophageal reflux disease without esophagitis: Secondary | ICD-10-CM | POA: Diagnosis not present

## 2022-05-08 DIAGNOSIS — Z79811 Long term (current) use of aromatase inhibitors: Secondary | ICD-10-CM | POA: Diagnosis not present

## 2022-05-08 DIAGNOSIS — D0500 Lobular carcinoma in situ of unspecified breast: Secondary | ICD-10-CM

## 2022-05-08 DIAGNOSIS — E785 Hyperlipidemia, unspecified: Secondary | ICD-10-CM | POA: Diagnosis not present

## 2022-05-08 DIAGNOSIS — Z87442 Personal history of urinary calculi: Secondary | ICD-10-CM | POA: Insufficient documentation

## 2022-05-08 DIAGNOSIS — Z791 Long term (current) use of non-steroidal anti-inflammatories (NSAID): Secondary | ICD-10-CM | POA: Diagnosis not present

## 2022-05-08 DIAGNOSIS — N1832 Chronic kidney disease, stage 3b: Secondary | ICD-10-CM | POA: Diagnosis not present

## 2022-05-08 DIAGNOSIS — Z170 Estrogen receptor positive status [ER+]: Secondary | ICD-10-CM | POA: Insufficient documentation

## 2022-05-08 DIAGNOSIS — R232 Flushing: Secondary | ICD-10-CM | POA: Diagnosis not present

## 2022-05-08 DIAGNOSIS — Z79899 Other long term (current) drug therapy: Secondary | ICD-10-CM | POA: Insufficient documentation

## 2022-05-08 DIAGNOSIS — Z1231 Encounter for screening mammogram for malignant neoplasm of breast: Secondary | ICD-10-CM

## 2022-05-08 DIAGNOSIS — Z9189 Other specified personal risk factors, not elsewhere classified: Secondary | ICD-10-CM

## 2022-05-08 DIAGNOSIS — Z800 Family history of malignant neoplasm of digestive organs: Secondary | ICD-10-CM | POA: Insufficient documentation

## 2022-05-08 DIAGNOSIS — D0502 Lobular carcinoma in situ of left breast: Secondary | ICD-10-CM | POA: Insufficient documentation

## 2022-05-08 LAB — COMPREHENSIVE METABOLIC PANEL
ALT: 11 U/L (ref 0–44)
AST: 19 U/L (ref 15–41)
Albumin: 3.3 g/dL — ABNORMAL LOW (ref 3.5–5.0)
Alkaline Phosphatase: 56 U/L (ref 38–126)
Anion gap: 10 (ref 5–15)
BUN: 23 mg/dL (ref 8–23)
CO2: 22 mmol/L (ref 22–32)
Calcium: 10.4 mg/dL — ABNORMAL HIGH (ref 8.9–10.3)
Chloride: 108 mmol/L (ref 98–111)
Creatinine, Ser: 1.81 mg/dL — ABNORMAL HIGH (ref 0.44–1.00)
GFR, Estimated: 30 mL/min — ABNORMAL LOW (ref >60)
Glucose, Bld: 106 mg/dL — ABNORMAL HIGH (ref 70–99)
Potassium: 4.4 mmol/L (ref 3.5–5.1)
Sodium: 140 mmol/L (ref 135–145)
Total Bilirubin: 0.5 mg/dL (ref 0.3–1.2)
Total Protein: 7.7 g/dL (ref 6.5–8.1)

## 2022-05-08 LAB — CBC WITH DIFFERENTIAL/PLATELET
Abs Immature Granulocytes: 0.04 10*3/uL (ref 0.00–0.07)
Basophils Absolute: 0.1 10*3/uL (ref 0.0–0.1)
Basophils Relative: 1 %
Eosinophils Absolute: 0 10*3/uL (ref 0.0–0.5)
Eosinophils Relative: 0 %
HCT: 33.4 % — ABNORMAL LOW (ref 36.0–46.0)
Hemoglobin: 11 g/dL — ABNORMAL LOW (ref 12.0–15.0)
Immature Granulocytes: 0 %
Lymphocytes Relative: 23 %
Lymphs Abs: 2.2 10*3/uL (ref 0.7–4.0)
MCH: 29.6 pg (ref 26.0–34.0)
MCHC: 32.9 g/dL (ref 30.0–36.0)
MCV: 90 fL (ref 80.0–100.0)
Monocytes Absolute: 0.5 10*3/uL (ref 0.1–1.0)
Monocytes Relative: 5 %
Neutro Abs: 6.8 10*3/uL (ref 1.7–7.7)
Neutrophils Relative %: 71 %
Platelets: 311 10*3/uL (ref 150–400)
RBC: 3.71 MIL/uL — ABNORMAL LOW (ref 3.87–5.11)
RDW: 12.7 % (ref 11.5–15.5)
WBC: 9.6 10*3/uL (ref 4.0–10.5)
nRBC: 0 % (ref 0.0–0.2)

## 2022-05-08 LAB — FERRITIN: Ferritin: 71 ng/mL (ref 11–307)

## 2022-05-08 LAB — IRON AND TIBC
Iron: 56 ug/dL (ref 28–170)
Saturation Ratios: 18 % (ref 10.4–31.8)
TIBC: 307 ug/dL (ref 250–450)
UIBC: 251 ug/dL

## 2022-05-08 LAB — VITAMIN B12: Vitamin B-12: 291 pg/mL (ref 180–914)

## 2022-05-08 MED ORDER — TAMOXIFEN CITRATE 20 MG PO TABS: 20.0000 mg | ORAL_TABLET | Freq: Every day | ORAL | 1 refills | Status: DC

## 2022-05-08 NOTE — Progress Notes (Signed)
?Hematology/Oncology Progress note ?[phone] [phone] ?  ? ? ? ?Patient Care Team: ?Genoveva Ill, FNP as PCP - General (Family Medicine) ?Earlie Server, MD as Consulting Physician (Hematology and Oncology) ? ?REFERRING PROVIDER: ?Genoveva Ill, [place]  ?CHIEF COMPLAINTS/REASON FOR VISIT:  ?LCIS, management of endocrinology therapy, breast cancer screening for high risk patient. ? ?HISTORY OF PRESENTING ILLNESS:  ? ?Aimee Hill is a  71 y.o.  female with PMH listed below was seen in consultation at the request of  Genoveva Ill, [place]  for evaluation of LCIS, management of endocrinology therapy ?Previously managed by Dr.Byrnett. recently seen by Dr.Piscoya  ?07/22/2018 left breast biopsy showed complex sclerosis lesion with atypical lobular hyperplasia, duct ectasia.  ?08/06/2018 left breast excisional biopsy showed focus of LCIS arising from complex  sclerosis lesion with intraductal papillomas. Calcification associated with CSL and LCIS. cystic papillary aprocrine metaplasia.  ?Patient was started on Tamoxifen for chemoprevention after surgery. ?She developed severe hot flash and was started on Effexor 37.5 mg daily, later increased to 37.5 mg twice daily.  He reports increased dose helped her symptoms to some extent however she still constantly bothered by symptoms. ?She was referred to heme-onc for an opinion if any alternatives can be used. ?No new complaints. ?Recent mammogram on 08/03/2019 showed no mammographic evidence of malignancy. ?September 2019, tamoxifen for prophylaxis ?INTERVAL HISTORY ?Aimee Hill is a 71 y.o. female who has above history reviewed by me today presents for follow up visit for management of LCIS.  ?Problems and complaints are listed below: ?Patient has been on tamoxifen 20 mg daily.  Overall she tolerates well.  She takes Effexor which helps her symptoms. ?She has no new breast concerns. ?During interval, patient has had a bilateral MRI done. ? ?Review of  Systems  ?Constitutional:  Negative for appetite change, chills, fatigue and fever.  ?HENT:   Negative for hearing loss and voice change.   ?Eyes:  Negative for eye problems.  ?Respiratory:  Negative for chest tightness and cough.   ?Cardiovascular:  Negative for chest pain.  ?Gastrointestinal:  Negative for abdominal distention, abdominal pain and blood in stool.  ?Endocrine: Positive for hot flashes.  ?Genitourinary:  Negative for difficulty urinating and frequency.   ?Musculoskeletal:  Negative for arthralgias.  ?Skin:  Negative for itching and rash.  ?Neurological:  Negative for extremity weakness.  ?Hematological:  Negative for adenopathy.  ?Psychiatric/Behavioral:  Negative for confusion.   ? ?MEDICAL HISTORY:  ?Past Medical History:  ?Diagnosis Date  ? Complication of anesthesia   ? TAKES MORE TO SEDATE HER  ? Family history of adverse reaction to anesthesia   ? SISTER N/V  ? Fracture 2013  ? left foot and second and third toes on left foot  ? GERD (gastroesophageal reflux disease)   ? Headache   ? History of kidney stones   ? Hyperlipidemia   ? Hypertension   ? Hypothyroidism   ? Osteoarthritis   ? PONV (postoperative nausea and vomiting)   ? Urinary calculus   ? ? ?SURGICAL HISTORY: ?Past Surgical History:  ?Procedure Laterality Date  ? BREAST BIOPSY Left 07/22/2018  ? affirm bx coil marker path pending  ? BREAST BIOPSY Left 08/06/2018  ? Procedure: BREAST BIOPSY;  Surgeon: Robert Bellow, MD;  Location: [facility] ORS;  Service: General;  Laterality: Left;  ? BREAST EXCISIONAL BIOPSY Left 08/06/2018  ? lumpectomy atypical lobular hyperplasia   ? BREAST LUMPECTOMY Left 08/10/2018  ? ADH removed  ? COLONOSCOPY  2013  ?  negative/Dr. Kathyrn Sheriff [place]-next one due 2023  ? DILATION AND CURETTAGE, DIAGNOSTIC / THERAPEUTIC    ? SALPINGOOPHORECTOMY Right 1983  ? 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 TOOTH EXTRACTION    ? TOTAL ABDOMINAL HYSTERECTOMY  1983  ? Dr. Rayford Halsted  ? TUBAL LIGATION    ? URETERAL STENT  PLACEMENT  1997  ? ? ?SOCIAL HISTORY: ?Social History  ? ?Socioeconomic History  ? Marital status: Married  ?  Spouse name: Not on file  ? Number of children: 1  ? Years of education: 37  ? Highest education level: Not on file  ?Occupational History  ? Occupation: Network engineer  ?Tobacco Use  ? Smoking status: Never  ? Smokeless tobacco: Never  ?Vaping Use  ? Vaping Use: Never used  ?Substance and Sexual Activity  ? Alcohol use: No  ? Drug use: No  ? Sexual activity: Yes  ?  Partners: Male  ?  Birth control/protection: Surgical  ?Other Topics Concern  ? Not on file  ?Social History Narrative  ? Not on file  ? ?Social Determinants of Health  ? ?Financial Resource Strain: Not on file  ?Food Insecurity: Not on file  ?Transportation Needs: Not on file  ?Physical Activity: Not on file  ?Stress: Not on file  ?Social Connections: Not on file  ?Intimate Partner Violence: Not on file  ? ? ?FAMILY HISTORY: ?Family History  ?Problem Relation Age of Onset  ? Alzheimer's disease Mother   ?     died at age 30 from internal bleeding  ? Rheum arthritis Mother   ? Brain cancer Father 53  ? Lymphoma Paternal Aunt 25  ? Colon cancer Paternal Aunt   ? Breast cancer Neg Hx   ? ? ?ALLERGIES:  is allergic to penicillins. ? ?MEDICATIONS:  ?Current Outpatient Medications  ?Medication Sig Dispense Refill  ? ASPIRIN 81 PO Take 1 tablet by mouth.    ? Cholecalciferol (VITAMIN D3) 100000 UNIT/GM POWD Take by mouth.    ? furosemide (LASIX) 20 MG tablet 3 (three) times a week.     ? levothyroxine (SYNTHROID, LEVOTHROID) 50 MCG tablet Take 50 mcg by mouth daily before breakfast.   10  ? Multiple Vitamins-Minerals (MULTI-DAY PLUS MINERALS) TABS Take 1 tablet by mouth daily.    ? omeprazole (PRILOSEC) 20 MG capsule Take 20 mg by mouth 2 (two) times daily.     ? pravastatin (PRAVACHOL) 10 MG tablet Take 10 mg by mouth every morning.   10  ? valACYclovir (VALTREX) 1000 MG tablet Take 1,000 mg by mouth daily as needed.    ? venlafaxine (EFFEXOR) 37.5 MG  tablet TAKE ONE TABLET BY MOUTH TWICE DAILY 60 tablet 5  ? verapamil (CALAN-SR) 120 MG CR tablet Take 120 mg by mouth every morning.    ? zolpidem (AMBIEN) 5 MG tablet Take 5 mg by mouth at bedtime as needed.   5  ? meloxicam (MOBIC) 15 MG tablet Take 15 mg by mouth daily. (Patient not taking: Reported on 05/08/2022)    ? nabumetone (RELAFEN) 500 MG tablet Take 500 mg by mouth 2 (two) times daily.  (Patient not taking: Reported on 05/08/2022)    ? tamoxifen (NOLVADEX) 20 MG tablet Take 1 tablet (20 mg total) by mouth daily. 90 tablet 1  ? ?No current facility-administered medications for this visit.  ? ? ? ?PHYSICAL EXAMINATION: ?ECOG PERFORMANCE STATUS: 1 - Symptomatic but completely ambulatory ?Vitals:  ? 05/08/22 1046  ?BP: 122/62  ?Pulse: 83  ?Resp: 18  ?  SpO2: 99%  ? ?Filed Weights  ? 05/08/22 1041  ?Weight: 179 lb (81.2 kg)  ? ? ?Physical Exam ?Constitutional:   ?   General: She is not in acute distress. ?HENT:  ?   Head: Normocephalic and atraumatic.  ?Eyes:  ?   General: No scleral icterus. ?   Pupils: Pupils are equal, round, and reactive to light.  ?Cardiovascular:  ?   Rate and Rhythm: Normal rate and regular rhythm.  ?   Heart sounds: Normal heart sounds.  ?Pulmonary:  ?   Effort: Pulmonary effort is normal. No respiratory distress.  ?   Breath sounds: No wheezing.  ?Abdominal:  ?   General: Bowel sounds are normal. There is no distension.  ?   Palpations: Abdomen is soft. There is no mass.  ?   Tenderness: There is no abdominal tenderness.  ?Musculoskeletal:     ?   General: No deformity. Normal range of motion.  ?   Cervical back: Normal range of motion and neck supple.  ?Skin: ?   General: Skin is warm and dry.  ?   Findings: No erythema or rash.  ?Neurological:  ?   Mental Status: She is alert and oriented to person, place, and time.  ?   Cranial Nerves: No cranial nerve deficit.  ?   Coordination: Coordination normal.  ?Psychiatric:     ?   Behavior: Behavior normal.     ?   Thought Content: Thought  content normal.  ? ?Breast exam was performed in seated and lying down position. ?Patient is status post left lumpectomy with a well-healed surgical scar.  No palpable breast masses bilaterally.  No palpable axillar

## 2022-05-22 ENCOUNTER — Inpatient Hospital Stay: Payer: Medicare Other

## 2022-05-29 ENCOUNTER — Inpatient Hospital Stay: Payer: Medicare Other | Attending: Oncology

## 2022-05-29 DIAGNOSIS — Z170 Estrogen receptor positive status [ER+]: Secondary | ICD-10-CM | POA: Insufficient documentation

## 2022-05-29 DIAGNOSIS — D0502 Lobular carcinoma in situ of left breast: Secondary | ICD-10-CM | POA: Insufficient documentation

## 2022-05-29 DIAGNOSIS — D0500 Lobular carcinoma in situ of unspecified breast: Secondary | ICD-10-CM

## 2022-05-29 LAB — BASIC METABOLIC PANEL
Anion gap: 10 (ref 5–15)
BUN: 19 mg/dL (ref 8–23)
CO2: 21 mmol/L — ABNORMAL LOW (ref 22–32)
Calcium: 10 mg/dL (ref 8.9–10.3)
Chloride: 108 mmol/L (ref 98–111)
Creatinine, Ser: 1.29 mg/dL — ABNORMAL HIGH (ref 0.44–1.00)
GFR, Estimated: 45 mL/min — ABNORMAL LOW (ref >60)
Glucose, Bld: 88 mg/dL (ref 70–99)
Potassium: 4.2 mmol/L (ref 3.5–5.1)
Sodium: 139 mmol/L (ref 135–145)

## 2022-06-02 ENCOUNTER — Encounter: Payer: Self-pay | Admitting: Oncology

## 2022-06-02 DIAGNOSIS — E538 Deficiency of other specified B group vitamins: Secondary | ICD-10-CM | POA: Insufficient documentation

## 2022-06-02 DIAGNOSIS — Z9229 Personal history of other drug therapy: Secondary | ICD-10-CM | POA: Insufficient documentation

## 2022-08-27 ENCOUNTER — Ambulatory Visit
Admission: RE | Admit: 2022-08-27 | Discharge: 2022-08-27 | Disposition: A | Discharge status: Home / Self Care | Payer: Medicare Other | Source: Ambulatory Visit | Attending: Oncology | Admitting: Oncology

## 2022-08-27 DIAGNOSIS — Z1231 Encounter for screening mammogram for malignant neoplasm of breast: Secondary | ICD-10-CM | POA: Diagnosis not present

## 2022-08-27 DIAGNOSIS — Z9189 Other specified personal risk factors, not elsewhere classified: Secondary | ICD-10-CM

## 2022-11-08 ENCOUNTER — Encounter: Payer: Self-pay | Admitting: Oncology

## 2022-11-08 ENCOUNTER — Inpatient Hospital Stay: Payer: Medicare Other | Attending: Oncology

## 2022-11-08 ENCOUNTER — Inpatient Hospital Stay (HOSPITAL_BASED_OUTPATIENT_CLINIC_OR_DEPARTMENT_OTHER): Payer: Medicare Other | Admitting: Oncology

## 2022-11-08 ENCOUNTER — Telehealth: Payer: Self-pay | Admitting: Oncology

## 2022-11-08 VITALS — BP 118/75 | HR 90 | Temp 98.3°F | Resp 18 | Wt 174.7 lb

## 2022-11-08 DIAGNOSIS — Z9189 Other specified personal risk factors, not elsewhere classified: Secondary | ICD-10-CM

## 2022-11-08 DIAGNOSIS — Z79899 Other long term (current) drug therapy: Secondary | ICD-10-CM | POA: Diagnosis not present

## 2022-11-08 DIAGNOSIS — C50912 Malignant neoplasm of unspecified site of left female breast: Secondary | ICD-10-CM | POA: Insufficient documentation

## 2022-11-08 DIAGNOSIS — E785 Hyperlipidemia, unspecified: Secondary | ICD-10-CM | POA: Insufficient documentation

## 2022-11-08 DIAGNOSIS — Z791 Long term (current) use of non-steroidal anti-inflammatories (NSAID): Secondary | ICD-10-CM | POA: Insufficient documentation

## 2022-11-08 DIAGNOSIS — Z800 Family history of malignant neoplasm of digestive organs: Secondary | ICD-10-CM | POA: Insufficient documentation

## 2022-11-08 DIAGNOSIS — K219 Gastro-esophageal reflux disease without esophagitis: Secondary | ICD-10-CM | POA: Diagnosis not present

## 2022-11-08 DIAGNOSIS — Z808 Family history of malignant neoplasm of other organs or systems: Secondary | ICD-10-CM | POA: Diagnosis not present

## 2022-11-08 DIAGNOSIS — I129 Hypertensive chronic kidney disease with stage 1 through stage 4 chronic kidney disease, or unspecified chronic kidney disease: Secondary | ICD-10-CM | POA: Insufficient documentation

## 2022-11-08 DIAGNOSIS — D0502 Lobular carcinoma in situ of left breast: Secondary | ICD-10-CM

## 2022-11-08 DIAGNOSIS — Z79810 Long term (current) use of selective estrogen receptor modulators (SERMs): Secondary | ICD-10-CM | POA: Diagnosis not present

## 2022-11-08 DIAGNOSIS — E538 Deficiency of other specified B group vitamins: Secondary | ICD-10-CM

## 2022-11-08 DIAGNOSIS — N1831 Chronic kidney disease, stage 3a: Secondary | ICD-10-CM | POA: Diagnosis not present

## 2022-11-08 DIAGNOSIS — E039 Hypothyroidism, unspecified: Secondary | ICD-10-CM | POA: Diagnosis not present

## 2022-11-08 DIAGNOSIS — Z79890 Hormone replacement therapy (postmenopausal): Secondary | ICD-10-CM | POA: Insufficient documentation

## 2022-11-08 DIAGNOSIS — N1832 Chronic kidney disease, stage 3b: Secondary | ICD-10-CM

## 2022-11-08 LAB — CBC WITH DIFFERENTIAL/PLATELET
Abs Immature Granulocytes: 0.04 10*3/uL (ref 0.00–0.07)
Basophils Absolute: 0.1 10*3/uL (ref 0.0–0.1)
Basophils Relative: 1 %
Eosinophils Absolute: 0 10*3/uL (ref 0.0–0.5)
Eosinophils Relative: 0 %
HCT: 36.1 % (ref 36.0–46.0)
Hemoglobin: 11.9 g/dL — ABNORMAL LOW (ref 12.0–15.0)
Immature Granulocytes: 1 %
Lymphocytes Relative: 34 %
Lymphs Abs: 2.5 10*3/uL (ref 0.7–4.0)
MCH: 29.8 pg (ref 26.0–34.0)
MCHC: 33 g/dL (ref 30.0–36.0)
MCV: 90.3 fL (ref 80.0–100.0)
Monocytes Absolute: 0.5 10*3/uL (ref 0.1–1.0)
Monocytes Relative: 7 %
Neutro Abs: 4.3 10*3/uL (ref 1.7–7.7)
Neutrophils Relative %: 57 %
Platelets: 224 10*3/uL (ref 150–400)
RBC: 4 MIL/uL (ref 3.87–5.11)
RDW: 14.5 % (ref 11.5–15.5)
WBC: 7.4 10*3/uL (ref 4.0–10.5)
nRBC: 0 % (ref 0.0–0.2)

## 2022-11-08 LAB — COMPREHENSIVE METABOLIC PANEL
ALT: 13 U/L (ref 0–44)
AST: 26 U/L (ref 15–41)
Albumin: 3.6 g/dL (ref 3.5–5.0)
Alkaline Phosphatase: 65 U/L (ref 38–126)
Anion gap: 8 (ref 5–15)
BUN: 20 mg/dL (ref 8–23)
CO2: 25 mmol/L (ref 22–32)
Calcium: 10.3 mg/dL (ref 8.9–10.3)
Chloride: 105 mmol/L (ref 98–111)
Creatinine, Ser: 1.39 mg/dL — ABNORMAL HIGH (ref 0.44–1.00)
GFR, Estimated: 41 mL/min — ABNORMAL LOW (ref >60)
Glucose, Bld: 92 mg/dL (ref 70–99)
Potassium: 4.3 mmol/L (ref 3.5–5.1)
Sodium: 138 mmol/L (ref 135–145)
Total Bilirubin: 0.2 mg/dL — ABNORMAL LOW (ref 0.3–1.2)
Total Protein: 7.4 g/dL (ref 6.5–8.1)

## 2022-11-08 LAB — VITAMIN B12: Vitamin B-12: 707 pg/mL (ref 180–914)

## 2022-11-08 LAB — FERRITIN: Ferritin: 31 ng/mL (ref 11–307)

## 2022-11-08 LAB — IRON AND TIBC
Iron: 73 ug/dL (ref 28–170)
Saturation Ratios: 20 % (ref 10.4–31.8)
TIBC: 374 ug/dL (ref 250–450)
UIBC: 301 ug/dL

## 2022-11-08 MED ORDER — TAMOXIFEN CITRATE 20 MG PO TABS: 20.0000 mg | ORAL_TABLET | Freq: Every day | ORAL | 1 refills | Status: DC

## 2022-11-08 MED ORDER — VENLAFAXINE HCL 37.5 MG PO TABS: 37.5000 mg | ORAL_TABLET | Freq: Two times a day (BID) | ORAL | 5 refills | Status: DC

## 2022-11-08 NOTE — Progress Notes (Signed)
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  Continue Effexor 37.5 mg twice daily and aspirin 81 mg daily  #History of hormone replacement.  Personal history of LCIS. Tyrer Cuzick risk model -lifetime risk 38.7%, personal 10-year risk 25% Continue annual bilateral mammogram and bilateral Breast MRI   Stage 3a chronic kidney disease (HCC) Avoid nephrotoxins. Encourage oral hydration  B12 deficiency continue vitamin B-12 1000 MCG daily  Orders Placed This Encounter  Procedures   MR BREAST BILATERAL [place] CAD    Standing Status:   Future    Standing Expiration Date:   11/09/2023    Order Specific Question:   If indicated for the ordered procedure, I authorize the administration of contrast media per Radiology protocol    Answer:   Yes    Order Specific Question:   What is the patient's sedation requirement?    Answer:   No Sedation    Order Specific Question:   Does the patient have a pacemaker or implanted devices?    Answer:   No    Order Specific Question:   Radiology Contrast Protocol - do NOT remove file path    Answer:   [email or web address]    Order Specific Question:   Preferred imaging location?    Answer:   [facility] (table limit - 550lbs)   CBC with Differential/Platelet    Standing Status:   Future    Standing Expiration Date:   11/08/2023   Comprehensive metabolic panel    Standing Status:   Future    Standing Expiration Date:   11/08/2023   Follow up in 6 months.  All questions were answered. The patient knows to  call the clinic with any problems, questions or concerns.  Earlie Server, MD, PhD [facility] Hematology Oncology 11/08/2022    CHIEF COMPLAINTS/REASON FOR VISIT:  LCIS, management of endocrinology therapy, breast cancer screening for high risk patient.  HISTORY OF PRESENTING ILLNESS:   Aimee Hill is a  71 y.o.  female with PMH listed below was seen in consultation at the request of  Genoveva Ill, [place]  for evaluation of LCIS, management of endocrinology therapy Previously managed by Dr.Byrnett. later she switched to [place]   Oncology History  Lobular carcinoma in situ (LCIS) of left breast  08/06/2018 Initial Diagnosis   Lobular carcinoma in situ (LCIS) of left breast  07/22/2018 left breast biopsy showed complex sclerosis lesion with atypical lobular hyperplasia, duct ectasia.  08/06/2018 left breast excisional biopsy showed focus of LCIS arising from complex  sclerosis lesion with intraductal papillomas. Calcification associated with CSL and LCIS. cystic papillary aprocrine metaplasia.    08/13/2018 -  Anti-estrogen oral therapy   Started on Tamoxifen '20mg'$  daily.    02/11/2022 Imaging    bilateral breast MRI showed no MRI evidence to suggest malignancy.   08/28/2022 Mammogram   Bilateral screening mammogram showed No mammographic evidence of malignancy. A result letter of this screening mammogram will be mailed directly to the patient.     INTERVAL HISTORY Aimee Hill is a 71 y.o. female who has above history reviewed by me today presents for follow up visit for  management of LCIS.  Problems and complaints are listed below: Patient has been on tamoxifen 20 mg daily.  She take Effexor 37.'5mg'$  BID, her vasomotor symptoms are managed well.  During interval, patient has had a bilateral mammogram done. She has no new breast concerns.   Hospitalization 07/23/22 due to septic arthritis    Review of Systems  Constitutional:  Negative for appetite change, chills, fatigue and  fever.  HENT:   Negative for hearing loss and voice change.   Eyes:  Negative for eye problems.  Respiratory:  Negative for chest tightness and cough.   Cardiovascular:  Negative for chest pain.  Gastrointestinal:  Negative for abdominal distention, abdominal pain and blood in stool.  Endocrine: Positive for hot flashes.  Genitourinary:  Negative for difficulty urinating and frequency.   Musculoskeletal:  Positive for arthralgias.  Skin:  Negative for itching and rash.  Neurological:  Negative for extremity weakness.  Hematological:  Negative for adenopathy.  Psychiatric/Behavioral:  Negative for confusion.     MEDICAL HISTORY:  Past Medical History:  Diagnosis Date   Complication of anesthesia    TAKES MORE TO SEDATE HER   Family history of adverse reaction to anesthesia    SISTER N/V   Fracture 2013   left foot and second and third toes on left foot   GERD (gastroesophageal reflux disease)    Headache    History of kidney stones    Hyperlipidemia    Hypertension    Hypothyroidism    Osteoarthritis    PONV (postoperative nausea and vomiting)    Urinary calculus     SURGICAL HISTORY: Past Surgical History:  Procedure Laterality Date   BREAST BIOPSY Left 07/22/2018   affirm bx coil marker path pending   BREAST BIOPSY Left 08/06/2018   Procedure: BREAST BIOPSY;  Surgeon: Robert Bellow, MD;  Location: [facility] ORS;  Service: General;  Laterality: Left;   BREAST EXCISIONAL BIOPSY Left 08/06/2018   lumpectomy atypical lobular hyperplasia    BREAST LUMPECTOMY Left 08/10/2018   ADH removed   COLONOSCOPY  2013   negative/Dr. 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 Rayford Halsted   TUBAL LIGATION     URETERAL STENT PLACEMENT  1997    SOCIAL HISTORY: Social History   Socioeconomic History    Marital status: Married    Spouse name: Not on file   Number of children: 1   Years of education: 14   Highest education level: Not on file  Occupational History   Occupation: Network engineer  Tobacco Use   Smoking status: Never   Smokeless tobacco: Never  Vaping Use   Vaping Use: Never used  Substance and Sexual Activity   Alcohol use: No   Drug use: No   Sexual activity: Yes    Partners: Male    Birth control/protection: Surgical  Other Topics Concern   Not on file  Social History Narrative   Not on file   Social Determinants of Health   Financial Resource Strain: Not on file  Food Insecurity: Not on file  Transportation Needs: Not on file  Physical Activity: Not on file  Stress: Not on file  Social Connections: Not on file  Intimate Partner Violence: Not on file    FAMILY HISTORY: Family History  Problem Relation Age of Onset  Alzheimer's disease Mother        died at age 91 from internal bleeding   Rheum arthritis Mother    Brain cancer Father 57   Lymphoma Paternal Aunt 50   Colon cancer Paternal Aunt    Breast cancer Neg Hx     ALLERGIES:  is allergic to penicillins.  MEDICATIONS:  Current Outpatient Medications  Medication Sig Dispense Refill   allopurinol (ZYLOPRIM) 100 MG tablet Take 100 mg by mouth daily.     Cholecalciferol (VITAMIN D3) 100000 UNIT/GM POWD Take by mouth.     colchicine 0.6 MG tablet Take 0.6 mg by mouth daily.     furosemide (LASIX) 20 MG tablet 3 (three) times a week.      levothyroxine (SYNTHROID, LEVOTHROID) 50 MCG tablet Take 50 mcg by mouth daily before breakfast.   10   Multiple Vitamins-Minerals (MULTI-DAY PLUS MINERALS) TABS Take 1 tablet by mouth daily.     omeprazole (PRILOSEC) 20 MG capsule Take 20 mg by mouth 2 (two) times daily.      pravastatin (PRAVACHOL) 10 MG tablet Take 10 mg by mouth every morning.   10   valACYclovir (VALTREX) 1000 MG tablet Take 1,000 mg by mouth daily as needed.     venlafaxine (EFFEXOR) 37.5 MG  tablet TAKE ONE TABLET BY MOUTH TWICE DAILY 60 tablet 5   verapamil (CALAN-SR) 120 MG CR tablet Take 120 mg by mouth every morning.     zolpidem (AMBIEN) 5 MG tablet Take 5 mg by mouth at bedtime as needed.   5   meloxicam (MOBIC) 15 MG tablet Take 15 mg by mouth daily. (Patient not taking: Reported on 05/08/2022)     tamoxifen (NOLVADEX) 20 MG tablet Take 1 tablet (20 mg total) by mouth daily. 90 tablet 1   No current facility-administered medications for this visit.     PHYSICAL EXAMINATION: ECOG PERFORMANCE STATUS: 1 - Symptomatic but completely ambulatory Vitals:   11/08/22 0951  BP: 118/75  Pulse: 90  Resp: 18  Temp: 98.3 F (36.8 C)  SpO2: 100%   Filed Weights   11/08/22 0951  Weight: 174 lb 11.2 oz (79.2 kg)    Physical Exam Constitutional:      General: She is not in acute distress. HENT:     Head: Normocephalic and atraumatic.  Eyes:     General: No scleral icterus.    Pupils: Pupils are equal, round, and reactive to light.  Cardiovascular:     Rate and Rhythm: Normal rate and regular rhythm.     Heart sounds: Normal heart sounds.  Pulmonary:     Effort: Pulmonary effort is normal. No respiratory distress.     Breath sounds: No wheezing.  Abdominal:     General: Bowel sounds are normal. There is no distension.     Palpations: Abdomen is soft. There is no mass.     Tenderness: There is no abdominal tenderness.  Musculoskeletal:        General: No deformity. Normal range of motion.     Cervical back: Normal range of motion and neck supple.  Skin:    General: Skin is warm and dry.     Findings: No erythema or rash.  Neurological:     Mental Status: She is alert and oriented to person, place, and time.     Cranial Nerves: No cranial nerve deficit.     Coordination: Coordination normal.  Psychiatric:        Behavior: Behavior normal.  Thought Content: Thought content normal.    Breast exam was performed in seated and lying down position. Patient is  status post left lumpectomy with a well-healed surgical scar.  No palpable breast masses bilaterally.  No palpable axillary lymph adenopathy bilaterally   LABORATORY DATA:  I have reviewed the data as listed    Latest Ref Rng & Units 11/08/2022    9:34 AM 05/08/2022   10:30 AM 11/07/2021   10:27 AM  CBC  WBC 4.0 - 10.5 K/uL 7.4  9.6  7.1   Hemoglobin 12.0 - 15.0 g/dL 11.9  11.0  11.6   Hematocrit 36.0 - 46.0 % 36.1  33.4  36.3   Platelets 150 - 400 K/uL 224  311  212       RADIOGRAPHIC STUDIES: I have personally reviewed the radiological images as listed and agreed with the findings in the report. MM 3D SCREEN BREAST BILATERAL  Result Date: 08/28/2022 CLINICAL DATA:  Screening. EXAM: DIGITAL SCREENING BILATERAL MAMMOGRAM WITH TOMOSYNTHESIS AND CAD TECHNIQUE: Bilateral screening digital craniocaudal and mediolateral oblique mammograms were obtained. Bilateral screening digital breast tomosynthesis was performed. The images were evaluated with computer-aided detection. COMPARISON:  Previous exam(s). ACR Breast Density Category c: The breast tissue is heterogeneously dense, which may obscure small masses. FINDINGS: There are no findings suspicious for malignancy. IMPRESSION: No mammographic evidence of malignancy. A result letter of this screening mammogram will be mailed directly to the patient. RECOMMENDATION: Screening mammogram in one year. (Code:SM-B-01Y) BI-RADS CATEGORY  1: Negative. Electronically Signed   By: Claudie Revering M.D.   On: 08/28/2022 08:50

## 2022-11-08 NOTE — Assessment & Plan Note (Addendum)
LCIS, currently on chemoprevention with tamoxifen.-History of hysterectomy Labs reviewed and discussed with patient Continue tamoxifen 20 mg daily.  Continue Effexor 37.5 mg twice daily and aspirin 81 mg daily History of hystectomy  #History of hormone replacement.  Personal history of LCIS. Tyrer Cuzick risk model -lifetime risk 38.7%, personal 10-year risk 25% Continue annual bilateral mammogram and bilateral Breast MRI

## 2022-11-08 NOTE — Progress Notes (Signed)
Pt here for follow up. Pt reports that she was in hospital for 6 days in July due to Gout. No new breast problems.

## 2022-11-08 NOTE — Assessment & Plan Note (Signed)
Avoid nephrotoxins.  Encourage oral hydration  

## 2022-11-08 NOTE — Assessment & Plan Note (Signed)
continue vitamin B-12 1000 MCG daily

## 2022-11-08 NOTE — Addendum Note (Signed)
Addended by: Earlie Server on: 11/08/2022 10:29 PM   Modules accepted: Orders

## 2022-11-08 NOTE — Telephone Encounter (Signed)
Called [place] to have pt scheduled, transfered to [place] by [place]. LVM for JAmie with appt rq, MRN, and call back phone number for Myself.

## 2023-05-13 ENCOUNTER — Inpatient Hospital Stay: Payer: Medicare Other | Attending: Oncology

## 2023-05-13 ENCOUNTER — Inpatient Hospital Stay (HOSPITAL_BASED_OUTPATIENT_CLINIC_OR_DEPARTMENT_OTHER): Payer: Medicare Other | Admitting: Oncology

## 2023-05-13 ENCOUNTER — Encounter: Payer: Self-pay | Admitting: Oncology

## 2023-05-13 VITALS — BP 137/77 | HR 88 | Temp 96.9°F | Resp 18 | Wt 180.5 lb

## 2023-05-13 DIAGNOSIS — Z7982 Long term (current) use of aspirin: Secondary | ICD-10-CM | POA: Diagnosis not present

## 2023-05-13 DIAGNOSIS — Z170 Estrogen receptor positive status [ER+]: Secondary | ICD-10-CM | POA: Insufficient documentation

## 2023-05-13 DIAGNOSIS — D631 Anemia in chronic kidney disease: Secondary | ICD-10-CM

## 2023-05-13 DIAGNOSIS — E538 Deficiency of other specified B group vitamins: Secondary | ICD-10-CM | POA: Diagnosis not present

## 2023-05-13 DIAGNOSIS — N1832 Chronic kidney disease, stage 3b: Secondary | ICD-10-CM

## 2023-05-13 DIAGNOSIS — D0502 Lobular carcinoma in situ of left breast: Secondary | ICD-10-CM | POA: Insufficient documentation

## 2023-05-13 DIAGNOSIS — Z90710 Acquired absence of both cervix and uterus: Secondary | ICD-10-CM | POA: Insufficient documentation

## 2023-05-13 DIAGNOSIS — Z803 Family history of malignant neoplasm of breast: Secondary | ICD-10-CM | POA: Diagnosis not present

## 2023-05-13 DIAGNOSIS — Z79899 Other long term (current) drug therapy: Secondary | ICD-10-CM | POA: Insufficient documentation

## 2023-05-13 DIAGNOSIS — M1990 Unspecified osteoarthritis, unspecified site: Secondary | ICD-10-CM | POA: Diagnosis not present

## 2023-05-13 DIAGNOSIS — K219 Gastro-esophageal reflux disease without esophagitis: Secondary | ICD-10-CM | POA: Diagnosis not present

## 2023-05-13 DIAGNOSIS — Z79890 Hormone replacement therapy (postmenopausal): Secondary | ICD-10-CM | POA: Insufficient documentation

## 2023-05-13 DIAGNOSIS — Z800 Family history of malignant neoplasm of digestive organs: Secondary | ICD-10-CM | POA: Insufficient documentation

## 2023-05-13 DIAGNOSIS — N1831 Chronic kidney disease, stage 3a: Secondary | ICD-10-CM | POA: Diagnosis not present

## 2023-05-13 DIAGNOSIS — I129 Hypertensive chronic kidney disease with stage 1 through stage 4 chronic kidney disease, or unspecified chronic kidney disease: Secondary | ICD-10-CM | POA: Insufficient documentation

## 2023-05-13 DIAGNOSIS — Z79810 Long term (current) use of selective estrogen receptor modulators (SERMs): Secondary | ICD-10-CM | POA: Diagnosis not present

## 2023-05-13 DIAGNOSIS — Z1231 Encounter for screening mammogram for malignant neoplasm of breast: Secondary | ICD-10-CM

## 2023-05-13 DIAGNOSIS — Z87442 Personal history of urinary calculi: Secondary | ICD-10-CM | POA: Insufficient documentation

## 2023-05-13 DIAGNOSIS — E039 Hypothyroidism, unspecified: Secondary | ICD-10-CM | POA: Diagnosis not present

## 2023-05-13 DIAGNOSIS — E785 Hyperlipidemia, unspecified: Secondary | ICD-10-CM | POA: Insufficient documentation

## 2023-05-13 LAB — CBC WITH DIFFERENTIAL/PLATELET
Abs Immature Granulocytes: 0.03 10*3/uL (ref 0.00–0.07)
Basophils Absolute: 0.1 10*3/uL (ref 0.0–0.1)
Basophils Relative: 1 %
Eosinophils Absolute: 0.1 10*3/uL (ref 0.0–0.5)
Eosinophils Relative: 1 %
HCT: 34.6 % — ABNORMAL LOW (ref 36.0–46.0)
Hemoglobin: 11.2 g/dL — ABNORMAL LOW (ref 12.0–15.0)
Immature Granulocytes: 0 %
Lymphocytes Relative: 31 %
Lymphs Abs: 2.1 10*3/uL (ref 0.7–4.0)
MCH: 29.7 pg (ref 26.0–34.0)
MCHC: 32.4 g/dL (ref 30.0–36.0)
MCV: 91.8 fL (ref 80.0–100.0)
Monocytes Absolute: 0.4 10*3/uL (ref 0.1–1.0)
Monocytes Relative: 6 %
Neutro Abs: 4.2 10*3/uL (ref 1.7–7.7)
Neutrophils Relative %: 61 %
Platelets: 205 10*3/uL (ref 150–400)
RBC: 3.77 MIL/uL — ABNORMAL LOW (ref 3.87–5.11)
RDW: 14.3 % (ref 11.5–15.5)
WBC: 6.8 10*3/uL (ref 4.0–10.5)
nRBC: 0 % (ref 0.0–0.2)

## 2023-05-13 LAB — COMPREHENSIVE METABOLIC PANEL
ALT: 12 U/L (ref 0–44)
AST: 23 U/L (ref 15–41)
Albumin: 3.6 g/dL (ref 3.5–5.0)
Alkaline Phosphatase: 67 U/L (ref 38–126)
Anion gap: 10 (ref 5–15)
BUN: 19 mg/dL (ref 8–23)
CO2: 22 mmol/L (ref 22–32)
Calcium: 10.3 mg/dL (ref 8.9–10.3)
Chloride: 107 mmol/L (ref 98–111)
Creatinine, Ser: 1.31 mg/dL — ABNORMAL HIGH (ref 0.44–1.00)
GFR, Estimated: 44 mL/min — ABNORMAL LOW (ref >60)
Glucose, Bld: 107 mg/dL — ABNORMAL HIGH (ref 70–99)
Potassium: 4.3 mmol/L (ref 3.5–5.1)
Sodium: 139 mmol/L (ref 135–145)
Total Bilirubin: 0.3 mg/dL (ref 0.3–1.2)
Total Protein: 6.7 g/dL (ref 6.5–8.1)

## 2023-05-13 MED ORDER — TAMOXIFEN CITRATE 20 MG PO TABS: 20.0000 mg | ORAL_TABLET | Freq: Every day | ORAL | 1 refills | Status: DC

## 2023-05-13 MED ORDER — VENLAFAXINE HCL 37.5 MG PO TABS: 37.5000 mg | ORAL_TABLET | Freq: Two times a day (BID) | ORAL | 5 refills | Status: DC

## 2023-05-13 NOTE — Progress Notes (Signed)
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  Continue Effexor 37.5 mg twice daily and she is off aspirin 81 mg daily after right knee aspiration. She will resume Aspirin 81mg  daily History of hystectomy     Stage 3a chronic kidney disease (HCC) Avoid nephrotoxins. Encourage oral hydration  B12 deficiency continue vitamin B-12 1000 MCG every other day  Anemia in CKD (chronic kidney disease) Mild anemia, due to CKD, stable.   Orders Placed This Encounter  Procedures   MM 3D SCREENING MAMMOGRAM BILATERAL BREAST    Standing Status:   Future    Standing Expiration Date:   05/12/2024    Order Specific Question:   Reason for Exam (SYMPTOM  OR DIAGNOSIS REQUIRED)    Answer:   screening mammo    Order Specific Question:   Preferred imaging location?    Answer:   [facility]   CBC with Differential (Cancer Center Only)    Standing Status:   Future    Standing Expiration Date:   05/12/2024   CMP (Cancer Center only)    Standing Status:   Future    Standing Expiration Date:   05/12/2024   Follow up in 6 months.  All questions were answered. The patient knows to call the clinic with any problems, questions or concerns.  Rickard Patience, MD, PhD [facility] Hematology Oncology 05/13/2023    CHIEF COMPLAINTS/REASON FOR VISIT:  LCIS, management of endocrinology therapy, breast cancer screening for high risk patient.  HISTORY OF PRESENTING ILLNESS:   Aimee Hill is a  72 y.o.  female with PMH listed below was seen in consultation at the request of  Meyer Cory, FNP  for evaluation of  LCIS, management of endocrinology therapy Previously managed by Dr.Byrnett. later she switched to Dr.Piscoya   Oncology History  Lobular carcinoma in situ (LCIS) of left breast  08/06/2018 Initial Diagnosis   Lobular carcinoma in situ (LCIS) of left breast  07/22/2018 left breast biopsy showed complex sclerosis lesion with atypical lobular hyperplasia, duct ectasia.  08/06/2018 left breast excisional biopsy showed focus of LCIS arising from complex  sclerosis lesion with intraductal papillomas. Calcification associated with CSL and LCIS. cystic papillary aprocrine metaplasia.    08/13/2018 -  Anti-estrogen oral therapy   Started on Tamoxifen 20mg  daily.    02/11/2022 Imaging    bilateral breast MRI showed no MRI evidence to suggest malignancy.   08/28/2022 Mammogram   Bilateral screening mammogram showed No mammographic evidence of malignancy. A result letter of this screening mammogram will be mailed directly to the patient.    Hospitalization 07/23/22 due to septic arthritis  Hypercalcemia [facility]HH versus primary hyperparathyroidism, she follows up with [facility] endocrinologist.   INTERVAL HISTORY Aimee Hill is a 72 y.o. female who has above history reviewed by me today presents for follow up visit for management of LCIS.  Problems and complaints are listed below: Patient has been on tamoxifen 20 mg daily.  She take Effexor 37.5mg  BID, her vasomotor symptoms are managed well.  She has no new breast concerns.  Currently off Aspirin after her hospitalization after septic arthritis     Review of Systems  Constitutional:  Negative for appetite change, chills, fatigue and fever.  HENT:   Negative for hearing loss and voice change.   Eyes:  Negative for eye problems.  Respiratory:  Negative for chest tightness and cough.   Cardiovascular:  Negative for chest pain.  Gastrointestinal:  Negative for abdominal distention, abdominal pain and blood in stool.  Endocrine: Positive for hot flashes.   Genitourinary:  Negative for difficulty urinating and frequency.   Musculoskeletal:  Positive for arthralgias.  Skin:  Negative for itching and rash.  Neurological:  Negative for extremity weakness.  Hematological:  Negative for adenopathy.  Psychiatric/Behavioral:  Negative for confusion.     MEDICAL HISTORY:  Past Medical History:  Diagnosis Date   Complication of anesthesia    TAKES MORE TO SEDATE HER   Family history of adverse reaction to anesthesia    SISTER N/V   Fracture 2013   left foot and second and third toes on left foot   GERD (gastroesophageal reflux disease)    Headache    History of kidney stones    Hyperlipidemia    Hypertension    Hypothyroidism    Osteoarthritis    PONV (postoperative nausea and vomiting)    Urinary calculus     SURGICAL HISTORY: Past Surgical History:  Procedure Laterality Date   BREAST BIOPSY Left 07/22/2018   affirm bx coil marker path pending   BREAST BIOPSY Left 08/06/2018   Procedure: BREAST BIOPSY;  Surgeon: Earline Mayotte, MD;  Location: [facility] ORS;  Service: General;  Laterality: Left;   BREAST EXCISIONAL BIOPSY Left 08/06/2018   lumpectomy atypical lobular hyperplasia    BREAST LUMPECTOMY Left 08/10/2018   ADH removed   COLONOSCOPY  2013   negative/Dr. 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 Harold Hedge   TUBAL LIGATION     URETERAL STENT PLACEMENT  1997    SOCIAL HISTORY: Social History   Socioeconomic History   Marital status: Married    Spouse name: Not on file   Number of children: 1   Years of education: 14   Highest education level: Not on file  Occupational History   Occupation: Diplomatic Services operational officer  Tobacco Use   Smoking status: Never   Smokeless tobacco: Never  Vaping Use   Vaping Use: Never used  Substance and Sexual Activity    Alcohol use: No   Drug use: No   Sexual activity: Yes    Partners: Male    Birth control/protection: Surgical  Other Topics Concern   Not on file  Social History Narrative   Not on file   Social Determinants of Health   Financial Resource Strain: Not on file  Food Insecurity: Not on file  Transportation Needs: Not on file  Physical Activity: Not on file  Stress: Not on file  Social Connections: Not on file  Intimate Partner Violence: Not on file    FAMILY HISTORY: Family History  Problem Relation Age of Onset   Alzheimer's disease Mother        died at age 68 from internal bleeding   Rheum arthritis Mother    Brain cancer Father 7   Lymphoma Paternal Aunt 45   Colon cancer Paternal Aunt    Breast cancer Neg Hx     ALLERGIES:  has no active allergies.  MEDICATIONS:  Current Outpatient Medications  Medication Sig Dispense Refill   allopurinol (ZYLOPRIM) 100 MG tablet Take 100 mg by mouth daily.     Cholecalciferol (VITAMIN D3) 100000 UNIT/GM POWD Take by mouth.     colchicine 0.6 MG tablet Take 0.6 mg by mouth daily.     furosemide (LASIX) 20 MG tablet 3 (three) times a week.      levothyroxine (SYNTHROID, LEVOTHROID) 50 MCG tablet Take 50 mcg by mouth daily before breakfast.   10   Multiple Vitamins-Minerals (MULTI-DAY PLUS MINERALS) TABS Take 1 tablet by mouth daily.     omeprazole (PRILOSEC) 20 MG capsule Take 20 mg by mouth 2 (two) times daily.      pravastatin (PRAVACHOL) 10 MG tablet Take 10 mg by mouth every morning.   10   valACYclovir (VALTREX) 1000 MG tablet Take 1,000 mg by mouth daily as needed.     verapamil (CALAN-SR) 120 MG CR tablet Take 120 mg by mouth every morning.     tamoxifen (NOLVADEX) 20 MG tablet Take 1 tablet (20 mg total) by mouth daily. 90 tablet 1   venlafaxine (EFFEXOR) 37.5 MG tablet Take 1 tablet (37.5 mg total) by mouth 2 (two) times daily. 60 tablet 5   No current facility-administered medications for this visit.     PHYSICAL  EXAMINATION: ECOG PERFORMANCE STATUS: 1 - Symptomatic but completely ambulatory Vitals:   05/13/23 1012  BP: 137/77  Pulse: 88  Resp: 18  Temp: (!) 96.9 F (36.1 C)  SpO2: 100%   Filed Weights   05/13/23 1012  Weight: 180 lb 8 oz (81.9 kg)    Physical Exam Constitutional:      General: She is not in acute distress. HENT:     Head: Normocephalic and atraumatic.  Eyes:     General: No scleral icterus.    Pupils: Pupils are equal, round, and reactive to light.  Cardiovascular:     Rate and Rhythm: Normal rate and regular rhythm.     Heart sounds: Normal heart sounds.  Pulmonary:     Effort: Pulmonary effort is normal. No respiratory distress.     Breath sounds: No wheezing.  Abdominal:     General: Bowel sounds are normal. There is no distension.     Palpations: Abdomen is soft. There is no mass.     Tenderness: There is no abdominal tenderness.  Musculoskeletal:        General: No deformity. Normal range of motion.     Cervical back: Normal range of motion and neck supple.  Skin:    General: Skin is warm and dry.     Findings: No erythema or rash.  Neurological:     Mental Status: She is alert and oriented to person, place, and time.     Cranial Nerves: No cranial nerve deficit.     Coordination: Coordination normal.  Psychiatric:        Behavior: Behavior normal.        Thought Content: Thought content normal.       LABORATORY DATA:  I have reviewed the data as listed    Latest Ref Rng & Units 05/13/2023   10:07 AM 11/08/2022    9:34 AM 05/08/2022   10:30 AM  CBC  WBC 4.0 - 10.5 K/uL 6.8  7.4  9.6   Hemoglobin 12.0 - 15.0 g/dL 40.9  81.1  91.4   Hematocrit 36.0 - 46.0 % 34.6  36.1  33.4   Platelets 150 - 400  K/uL 205  224  311       RADIOGRAPHIC STUDIES: I have personally reviewed the radiological images as listed and agreed with the findings in the report. No results found.

## 2023-05-13 NOTE — Assessment & Plan Note (Addendum)
continue vitamin B-12 1000 MCG every other day

## 2023-05-13 NOTE — Assessment & Plan Note (Signed)
Avoid nephrotoxins. Encourage oral hydration.  

## 2023-05-13 NOTE — Assessment & Plan Note (Addendum)
LCIS, currently on chemoprevention with tamoxifen.-History of hysterectomy Labs reviewed and discussed with patient Continue tamoxifen 20 mg daily.  Continue Effexor 37.5 mg twice daily and she is off aspirin 81 mg daily after right knee aspiration. She will resume Aspirin 81mg  daily History of hystectomy

## 2023-05-13 NOTE — Assessment & Plan Note (Signed)
Mild anemia, due to CKD, stable.

## 2023-05-22 ENCOUNTER — Ambulatory Visit: Payer: Medicare Other

## 2023-06-12 ENCOUNTER — Ambulatory Visit
Admission: RE | Admit: 2023-06-12 | Discharge: 2023-06-12 | Disposition: A | Discharge status: Home / Self Care | Payer: Medicare Other | Source: Ambulatory Visit | Attending: Oncology | Admitting: Oncology

## 2023-06-12 DIAGNOSIS — D0502 Lobular carcinoma in situ of left breast: Secondary | ICD-10-CM | POA: Insufficient documentation

## 2023-06-12 MED ORDER — GADOBUTROL 1 MMOL/ML IV SOLN
7.5000 mL | Freq: Once | INTRAVENOUS | Status: AC | PRN
  Administered 2023-06-12: 7.5 mL via INTRAVENOUS

## 2023-08-29 ENCOUNTER — Ambulatory Visit
Admission: RE | Admit: 2023-08-29 | Discharge: 2023-08-29 | Disposition: A | Discharge status: Home / Self Care | Payer: Medicare Other | Source: Ambulatory Visit | Attending: Oncology | Admitting: Oncology

## 2023-08-29 DIAGNOSIS — Z1231 Encounter for screening mammogram for malignant neoplasm of breast: Secondary | ICD-10-CM | POA: Insufficient documentation

## 2023-11-13 ENCOUNTER — Inpatient Hospital Stay: Payer: Medicare Other | Admitting: Oncology

## 2023-11-13 ENCOUNTER — Inpatient Hospital Stay: Payer: Medicare Other

## 2023-11-13 NOTE — Assessment & Plan Note (Deleted)
LCIS, currently on chemoprevention with tamoxifen.-History of hysterectomy Labs reviewed and discussed with patient S/p 5 years of tamoxifen 20 mg daily.   Continue Effexor 37.5 mg twice daily and she is off aspirin 81 mg daily after right knee aspiration. She will resume Aspirin 81mg  daily History of hystectomy

## 2023-11-26 ENCOUNTER — Inpatient Hospital Stay (HOSPITAL_BASED_OUTPATIENT_CLINIC_OR_DEPARTMENT_OTHER): Payer: Medicare Other | Admitting: Oncology

## 2023-11-26 ENCOUNTER — Inpatient Hospital Stay: Payer: Medicare Other | Attending: Oncology

## 2023-11-26 ENCOUNTER — Encounter: Payer: Self-pay | Admitting: Oncology

## 2023-11-26 VITALS — BP 138/77 | HR 65 | Temp 97.8°F | Resp 18 | Wt 185.2 lb

## 2023-11-26 DIAGNOSIS — N1831 Chronic kidney disease, stage 3a: Secondary | ICD-10-CM | POA: Diagnosis not present

## 2023-11-26 DIAGNOSIS — Z1231 Encounter for screening mammogram for malignant neoplasm of breast: Secondary | ICD-10-CM

## 2023-11-26 DIAGNOSIS — Z90710 Acquired absence of both cervix and uterus: Secondary | ICD-10-CM | POA: Insufficient documentation

## 2023-11-26 DIAGNOSIS — E538 Deficiency of other specified B group vitamins: Secondary | ICD-10-CM | POA: Diagnosis not present

## 2023-11-26 DIAGNOSIS — Z170 Estrogen receptor positive status [ER+]: Secondary | ICD-10-CM | POA: Insufficient documentation

## 2023-11-26 DIAGNOSIS — E785 Hyperlipidemia, unspecified: Secondary | ICD-10-CM | POA: Diagnosis not present

## 2023-11-26 DIAGNOSIS — D0502 Lobular carcinoma in situ of left breast: Secondary | ICD-10-CM

## 2023-11-26 DIAGNOSIS — E039 Hypothyroidism, unspecified: Secondary | ICD-10-CM | POA: Diagnosis not present

## 2023-11-26 DIAGNOSIS — D631 Anemia in chronic kidney disease: Secondary | ICD-10-CM

## 2023-11-26 DIAGNOSIS — I10 Essential (primary) hypertension: Secondary | ICD-10-CM | POA: Diagnosis not present

## 2023-11-26 DIAGNOSIS — Z79810 Long term (current) use of selective estrogen receptor modulators (SERMs): Secondary | ICD-10-CM | POA: Insufficient documentation

## 2023-11-26 DIAGNOSIS — Z79899 Other long term (current) drug therapy: Secondary | ICD-10-CM | POA: Diagnosis not present

## 2023-11-26 DIAGNOSIS — Z808 Family history of malignant neoplasm of other organs or systems: Secondary | ICD-10-CM | POA: Insufficient documentation

## 2023-11-26 DIAGNOSIS — Z87442 Personal history of urinary calculi: Secondary | ICD-10-CM | POA: Insufficient documentation

## 2023-11-26 DIAGNOSIS — N1832 Chronic kidney disease, stage 3b: Secondary | ICD-10-CM

## 2023-11-26 DIAGNOSIS — M109 Gout, unspecified: Secondary | ICD-10-CM | POA: Insufficient documentation

## 2023-11-26 DIAGNOSIS — Z7982 Long term (current) use of aspirin: Secondary | ICD-10-CM | POA: Insufficient documentation

## 2023-11-26 DIAGNOSIS — K219 Gastro-esophageal reflux disease without esophagitis: Secondary | ICD-10-CM | POA: Insufficient documentation

## 2023-11-26 DIAGNOSIS — M1990 Unspecified osteoarthritis, unspecified site: Secondary | ICD-10-CM | POA: Diagnosis not present

## 2023-11-26 LAB — CMP (CANCER CENTER ONLY)
ALT: 14 U/L (ref 0–44)
AST: 19 U/L (ref 15–41)
Albumin: 3.6 g/dL (ref 3.5–5.0)
Alkaline Phosphatase: 67 U/L (ref 38–126)
Anion gap: 6 (ref 5–15)
BUN: 17 mg/dL (ref 8–23)
CO2: 24 mmol/L (ref 22–32)
Calcium: 10 mg/dL (ref 8.9–10.3)
Chloride: 112 mmol/L — ABNORMAL HIGH (ref 98–111)
Creatinine: 1.13 mg/dL — ABNORMAL HIGH (ref 0.44–1.00)
GFR, Estimated: 52 mL/min — ABNORMAL LOW (ref >60)
Glucose, Bld: 101 mg/dL — ABNORMAL HIGH (ref 70–99)
Potassium: 4.2 mmol/L (ref 3.5–5.1)
Sodium: 142 mmol/L (ref 135–145)
Total Bilirubin: 0.2 mg/dL (ref <1.2)
Total Protein: 6.6 g/dL (ref 6.5–8.1)

## 2023-11-26 LAB — CBC WITH DIFFERENTIAL (CANCER CENTER ONLY)
Abs Immature Granulocytes: 0.04 10*3/uL (ref 0.00–0.07)
Basophils Absolute: 0 10*3/uL (ref 0.0–0.1)
Basophils Relative: 1 %
Eosinophils Absolute: 0.1 10*3/uL (ref 0.0–0.5)
Eosinophils Relative: 2 %
HCT: 33.9 % — ABNORMAL LOW (ref 36.0–46.0)
Hemoglobin: 11.1 g/dL — ABNORMAL LOW (ref 12.0–15.0)
Immature Granulocytes: 1 %
Lymphocytes Relative: 27 %
Lymphs Abs: 1.9 10*3/uL (ref 0.7–4.0)
MCH: 30.7 pg (ref 26.0–34.0)
MCHC: 32.7 g/dL (ref 30.0–36.0)
MCV: 93.6 fL (ref 80.0–100.0)
Monocytes Absolute: 0.5 10*3/uL (ref 0.1–1.0)
Monocytes Relative: 6 %
Neutro Abs: 4.5 10*3/uL (ref 1.7–7.7)
Neutrophils Relative %: 63 %
Platelet Count: 222 10*3/uL (ref 150–400)
RBC: 3.62 MIL/uL — ABNORMAL LOW (ref 3.87–5.11)
RDW: 14.9 % (ref 11.5–15.5)
WBC Count: 7.1 10*3/uL (ref 4.0–10.5)
nRBC: 0 % (ref 0.0–0.2)

## 2023-11-26 MED ORDER — TAMOXIFEN CITRATE 20 MG PO TABS: 20.0000 mg | ORAL_TABLET | Freq: Every day | ORAL | 1 refills | Status: DC

## 2023-11-26 MED ORDER — VENLAFAXINE HCL 37.5 MG PO TABS: 37.5000 mg | ORAL_TABLET | Freq: Two times a day (BID) | ORAL | 5 refills | Status: AC

## 2023-11-26 NOTE — Progress Notes (Signed)
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  stop tamoxifen as she has finished 5 years of treatment. She would like to stay on Effexor as it helps her headache. Continue Effexor 37.5 mg twice daily, I recommend patient to slowly taper down to daily if tolerable.  She will remain on Aspirin 81mg  for gout and other medical problems.   Stage 3a chronic kidney disease (HCC) Avoid nephrotoxins. Encourage oral hydration  B12 deficiency continue vitamin B-12 1000 MCG every other day  Anemia in CKD (chronic kidney disease) Mild anemia, due to CKD, stable.   Orders Placed This Encounter  Procedures   MM 3D SCREENING MAMMOGRAM BILATERAL BREAST    Standing Status:   Future    Standing Expiration Date:   11/25/2024    Order Specific Question:   Reason for Exam (SYMPTOM  OR DIAGNOSIS REQUIRED)    Answer:   Breast cancer    Order Specific Question:   Preferred imaging location?    Answer:   [facility]   MR BREAST BILATERAL W WO CONTRAST INC CAD    Standing Status:   Future    Standing Expiration Date:   03/26/2024    Order Specific Question:   If indicated for the ordered procedure, I authorize the administration of contrast media per Radiology protocol    Answer:   Yes    Order Specific Question:   What is the patient's sedation requirement?    Answer:   No Sedation    Order Specific Question:   Does the patient have a pacemaker or implanted devices?    Answer:   No    Order Specific Question:   Radiology Contrast Protocol - do NOT remove file path    Answer:    [email or web address]    Order Specific Question:   Preferred imaging location?    Answer:   [facility] (table limit - 550lbs)   CBC with Differential (Cancer Center Only)    Standing Status:   Future    Standing Expiration Date:   11/25/2024   Iron and TIBC    Standing Status:   Future    Standing Expiration Date:   11/25/2024   Ferritin    Standing Status:   Future    Standing Expiration Date:   11/25/2024   Retic Panel    Standing Status:   Future    Standing Expiration Date:   11/25/2024   Follow up in 6 months.  All questions were answered. The patient knows to call the clinic with any problems, questions or concerns.  Rickard Patience, MD, PhD [facility] Hematology Oncology 11/26/2023    CHIEF COMPLAINTS/REASON FOR VISIT:  LCIS, management of endocrinology therapy, breast cancer screening for high risk patient.  HISTORY OF PRESENTING ILLNESS:   Aimee Hill is a  72 y.o.  female with PMH listed below was seen in consultation at the request of  Meyer Cory, FNP  for evaluation of LCIS, management of endocrinology therapy Previously managed by Dr.Byrnett. later she switched to Dr.Piscoya   Oncology History  Lobular carcinoma in situ (LCIS) of left breast  08/06/2018 Initial Diagnosis  Lobular carcinoma in situ (LCIS) of left breast  07/22/2018 left breast biopsy showed complex sclerosis lesion with atypical lobular hyperplasia, duct ectasia.  08/06/2018 left breast excisional biopsy showed focus of LCIS arising from complex  sclerosis lesion with intraductal papillomas. Calcification associated with CSL and LCIS. cystic papillary aprocrine metaplasia.    08/13/2018 - 11/26/2023 Anti-estrogen oral therapy   Finished 5+ year of tamoxifen 20mg  daily.    02/11/2022 Imaging    bilateral breast MRI showed no MRI evidence to suggest malignancy.   08/28/2022 Mammogram   Bilateral screening mammogram showed No mammographic evidence of  malignancy. A result letter of this screening mammogram will be mailed directly to the patient.   06/12/2023 Imaging   Bilateral MRI breast with and without contrast negative   09/01/2023 Mammogram   Bilateral screening mammogram negative.    Hospitalization 07/23/22 due to septic arthritis  Hypercalcemia FHH versus primary hyperparathyroidism, she follows up with [facility] endocrinologist.   INTERVAL HISTORY Aimee Hill is a 72 y.o. female who has above history reviewed by me today presents for follow up visit for management of LCIS.  Problems and complaints are listed below: Patient has been on tamoxifen 20 mg daily.  She take Effexor 37.5mg  BID, her vasomotor symptoms are managed well.  She also feels that Effexor helps to decrease headache frequency. She has no new breast concerns.    Review of Systems  Constitutional:  Negative for appetite change, chills, fatigue and fever.  HENT:   Negative for hearing loss and voice change.   Eyes:  Negative for eye problems.  Respiratory:  Negative for chest tightness and cough.   Cardiovascular:  Negative for chest pain.  Gastrointestinal:  Negative for abdominal distention, abdominal pain and blood in stool.  Endocrine: Positive for hot flashes.  Genitourinary:  Negative for difficulty urinating and frequency.   Musculoskeletal:  Positive for arthralgias.  Skin:  Negative for itching and rash.  Neurological:  Negative for extremity weakness.  Hematological:  Negative for adenopathy.  Psychiatric/Behavioral:  Negative for confusion.     MEDICAL HISTORY:  Past Medical History:  Diagnosis Date   Complication of anesthesia    TAKES MORE TO SEDATE HER   Family history of adverse reaction to anesthesia    SISTER N/V   Fracture 2013   left foot and second and third toes on left foot   GERD (gastroesophageal reflux disease)    Headache    History of kidney stones    Hyperlipidemia    Hypertension    Hypothyroidism    Osteoarthritis     PONV (postoperative nausea and vomiting)    Urinary calculus     SURGICAL HISTORY: Past Surgical History:  Procedure Laterality Date   BREAST BIOPSY Left 07/22/2018   affirm bx coil marker path pending   BREAST BIOPSY Left 08/06/2018   Procedure: BREAST BIOPSY;  Surgeon: Earline Mayotte, MD;  Location: [facility] ORS;  Service: General;  Laterality: Left;   BREAST EXCISIONAL BIOPSY Left 08/06/2018   lumpectomy atypical lobular hyperplasia    BREAST LUMPECTOMY Left 08/10/2018   ADH removed   COLONOSCOPY  2013   negative/Dr. 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 Harold Hedge   TUBAL LIGATION     URETERAL STENT PLACEMENT  1997    SOCIAL  HISTORY: Social History   Socioeconomic History   Marital status: Married    Spouse name: Not on file   Number of children: 1   Years of education: 14   Highest education level: Not on file  Occupational History   Occupation: Diplomatic Services operational officer  Tobacco Use   Smoking status: Never   Smokeless tobacco: Never  Vaping Use   Vaping status: Never Used  Substance and Sexual Activity   Alcohol use: No   Drug use: No   Sexual activity: Yes    Partners: Male    Birth control/protection: Surgical  Other Topics Concern   Not on file  Social History Narrative   Not on file   Social Determinants of Health   Financial Resource Strain: Low Risk  (07/24/2022)   Received from [facility], [facility]   Overall Financial Resource Strain (CARDIA)    Difficulty of Paying Living Expenses: Not hard at all  Food Insecurity: No Food Insecurity (07/24/2022)   Received from [facility], [facility]   Hunger Vital Sign    Worried About Running Out of Food in the Last Year: Never true    Ran Out of Food in the Last Year: Never true  Transportation Needs: No  Transportation Needs (08/28/2022)   Received from [facility], [facility]   [facility] - Transportation    Lack of Transportation (Medical): No    Lack of Transportation (Non-Medical): No  Physical Activity: Insufficiently Active (11/06/2020)   Received from [facility], [facility]   Exercise Vital Sign    Days of Exercise per Week: 7 days    Minutes of Exercise per Session: 20 min  Stress: No Stress Concern Present (01/24/2023)   Received from [facility], [facility] of Occupational [facility] - Occupational Stress Questionnaire    Feeling of Stress : Not at all  Social Connections: Unknown (05/01/2022)   Received from [facility], [facility]   Social Network    Social Network: Not on file  Intimate Partner Violence: Unknown (03/27/2022)   Received from [facility], [facility]   HITS    Physically Hurt: Not on file    Insult or Talk Down To: Not on file    Threaten Physical Harm: Not on file    Scream or Curse: Not on file    FAMILY HISTORY: Family History  Problem Relation Age of Onset   Alzheimer's disease Mother        died at age 74 from internal bleeding   Rheum arthritis Mother    Brain cancer Father 44   Lymphoma Paternal Aunt 20   Colon cancer Paternal Aunt    Breast cancer Neg Hx     ALLERGIES:  has no active allergies.  MEDICATIONS:  Current Outpatient Medications  Medication Sig Dispense Refill   allopurinol (ZYLOPRIM) 100 MG tablet Take 100 mg by mouth daily.     Cholecalciferol (VITAMIN D3) 100000 UNIT/GM POWD Take by mouth.     levothyroxine (SYNTHROID, LEVOTHROID) 50 MCG tablet Take 50 mcg by mouth daily before breakfast.   10   Multiple Vitamins-Minerals (MULTI-DAY PLUS MINERALS) TABS Take 1 tablet by mouth daily.     omeprazole (PRILOSEC) 20 MG capsule Take 20 mg by mouth 2 (two) times daily.      pravastatin (PRAVACHOL) 10 MG tablet Take 10 mg by mouth every morning.   10   verapamil (CALAN-SR) 120 MG CR  tablet Take  120 mg by mouth every morning.     colchicine 0.6 MG tablet Take 0.6 mg by mouth daily. (Patient not taking: Reported on 11/26/2023)     valACYclovir (VALTREX) 1000 MG tablet Take 1,000 mg by mouth daily as needed. (Patient not taking: Reported on 11/26/2023)     venlafaxine (EFFEXOR) 37.5 MG tablet Take 1 tablet (37.5 mg total) by mouth 2 (two) times daily. 60 tablet 5   No current facility-administered medications for this visit.     PHYSICAL EXAMINATION: ECOG PERFORMANCE STATUS: 1 - Symptomatic but completely ambulatory Vitals:   11/26/23 1016  BP: 138/77  Pulse: 65  Resp: 18  Temp: 97.8 F (36.6 C)   Filed Weights   11/26/23 1016  Weight: 185 lb 3.2 oz (84 kg)    Physical Exam Constitutional:      General: She is not in acute distress. HENT:     Head: Normocephalic and atraumatic.  Eyes:     General: No scleral icterus.    Pupils: Pupils are equal, round, and reactive to light.  Cardiovascular:     Rate and Rhythm: Normal rate and regular rhythm.     Heart sounds: Normal heart sounds.  Pulmonary:     Effort: Pulmonary effort is normal. No respiratory distress.     Breath sounds: No wheezing.  Abdominal:     General: Bowel sounds are normal. There is no distension.     Palpations: Abdomen is soft. There is no mass.     Tenderness: There is no abdominal tenderness.  Musculoskeletal:        General: No deformity. Normal range of motion.     Cervical back: Normal range of motion and neck supple.  Skin:    General: Skin is warm and dry.     Findings: No erythema or rash.  Neurological:     Mental Status: She is alert and oriented to person, place, and time.     Cranial Nerves: No cranial nerve deficit.     Coordination: Coordination normal.  Psychiatric:        Behavior: Behavior normal.        Thought Content: Thought content normal.       LABORATORY DATA:  I have reviewed the data as listed    Latest Ref Rng & Units 11/26/2023   10:04 AM  05/13/2023   10:07 AM 11/08/2022    9:34 AM  CBC  WBC 4.0 - 10.5 K/uL 7.1  6.8  7.4   Hemoglobin 12.0 - 15.0 g/dL 30.8  65.7  84.6   Hematocrit 36.0 - 46.0 % 33.9  34.6  36.1   Platelets 150 - 400 K/uL 222  205  224       RADIOGRAPHIC STUDIES: I have personally reviewed the radiological images as listed and agreed with the findings in the report. MM 3D SCREENING MAMMOGRAM BILATERAL BREAST  Result Date: 09/01/2023 CLINICAL DATA:  Screening. EXAM: DIGITAL SCREENING BILATERAL MAMMOGRAM WITH TOMOSYNTHESIS AND CAD TECHNIQUE: Bilateral screening digital craniocaudal and mediolateral oblique mammograms were obtained. Bilateral screening digital breast tomosynthesis was performed. The images were evaluated with computer-aided detection. COMPARISON:  Previous exam(s). ACR Breast Density Category c: The breasts are heterogeneously dense, which may obscure small masses. FINDINGS: There are no findings suspicious for malignancy. IMPRESSION: No mammographic evidence of malignancy. A result letter of this screening mammogram will be mailed directly to the patient. RECOMMENDATION: Screening mammogram in one year. (Code:SM-B-01Y) BI-RADS CATEGORY  1: Negative. Electronically Signed   By: Harriett Sine  Pete Glatter M.D.   On: 09/01/2023 08:49

## 2023-11-26 NOTE — Assessment & Plan Note (Signed)
continue vitamin B-12 1000 MCG every other day

## 2023-11-26 NOTE — Assessment & Plan Note (Signed)
Avoid nephrotoxins.  Encourage oral hydration  

## 2023-11-26 NOTE — Assessment & Plan Note (Signed)
Mild anemia, due to CKD, stable.

## 2023-11-26 NOTE — Assessment & Plan Note (Addendum)
LCIS, currently on chemoprevention with tamoxifen.-History of hysterectomy Labs reviewed and discussed with patient S/p 5 years of tamoxifen 20 mg daily.  stop tamoxifen as she has finished 5 years of treatment. She would like to stay on Effexor as it helps her headache. Continue Effexor 37.5 mg twice daily, I recommend patient to slowly taper down to daily if tolerable. She will remain on Aspirin 81mg  for gout and other medical problems.   #History of hormone replacement.  Personal history of LCIS. Tyrer Cuzick risk model -lifetime risk 38.7%, personal 10-year risk 25% Continue annual bilateral mammogram and bilateral Breast MRI

## 2024-02-24 ENCOUNTER — Ambulatory Visit: Admission: RE | Admit: 2024-02-24 | Payer: Medicare Other | Source: Ambulatory Visit

## 2024-08-30 ENCOUNTER — Ambulatory Visit
Admission: RE | Admit: 2024-08-30 | Discharge: 2024-08-30 | Disposition: A | Discharge status: Home / Self Care | Source: Ambulatory Visit | Attending: Oncology | Admitting: Oncology

## 2024-08-30 DIAGNOSIS — Z1231 Encounter for screening mammogram for malignant neoplasm of breast: Secondary | ICD-10-CM | POA: Diagnosis present

## 2024-09-03 ENCOUNTER — Other Ambulatory Visit: Payer: Self-pay | Admitting: Oncology

## 2024-09-03 DIAGNOSIS — R928 Other abnormal and inconclusive findings on diagnostic imaging of breast: Secondary | ICD-10-CM

## 2024-09-08 ENCOUNTER — Encounter: Payer: Self-pay | Admitting: Oncology

## 2024-09-08 ENCOUNTER — Inpatient Hospital Stay (HOSPITAL_BASED_OUTPATIENT_CLINIC_OR_DEPARTMENT_OTHER): Payer: Medicare Other | Admitting: Oncology

## 2024-09-08 ENCOUNTER — Inpatient Hospital Stay: Payer: Medicare Other | Attending: Oncology

## 2024-09-08 ENCOUNTER — Ambulatory Visit
Admission: RE | Admit: 2024-09-08 | Discharge: 2024-09-08 | Disposition: A | Discharge status: Home / Self Care | Source: Ambulatory Visit | Attending: Oncology | Admitting: Oncology

## 2024-09-08 VITALS — HR 81 | Temp 97.0°F | Resp 18 | Wt 187.0 lb

## 2024-09-08 DIAGNOSIS — Z79899 Other long term (current) drug therapy: Secondary | ICD-10-CM | POA: Insufficient documentation

## 2024-09-08 DIAGNOSIS — Z1231 Encounter for screening mammogram for malignant neoplasm of breast: Secondary | ICD-10-CM

## 2024-09-08 DIAGNOSIS — Z90710 Acquired absence of both cervix and uterus: Secondary | ICD-10-CM | POA: Diagnosis not present

## 2024-09-08 DIAGNOSIS — Z800 Family history of malignant neoplasm of digestive organs: Secondary | ICD-10-CM | POA: Insufficient documentation

## 2024-09-08 DIAGNOSIS — Z808 Family history of malignant neoplasm of other organs or systems: Secondary | ICD-10-CM | POA: Diagnosis not present

## 2024-09-08 DIAGNOSIS — N1832 Chronic kidney disease, stage 3b: Secondary | ICD-10-CM

## 2024-09-08 DIAGNOSIS — Z807 Family history of other malignant neoplasms of lymphoid, hematopoietic and related tissues: Secondary | ICD-10-CM | POA: Insufficient documentation

## 2024-09-08 DIAGNOSIS — N1831 Chronic kidney disease, stage 3a: Secondary | ICD-10-CM | POA: Diagnosis not present

## 2024-09-08 DIAGNOSIS — R928 Other abnormal and inconclusive findings on diagnostic imaging of breast: Secondary | ICD-10-CM | POA: Diagnosis present

## 2024-09-08 DIAGNOSIS — D631 Anemia in chronic kidney disease: Secondary | ICD-10-CM

## 2024-09-08 DIAGNOSIS — D0502 Lobular carcinoma in situ of left breast: Secondary | ICD-10-CM

## 2024-09-08 DIAGNOSIS — Z79810 Long term (current) use of selective estrogen receptor modulators (SERMs): Secondary | ICD-10-CM | POA: Diagnosis not present

## 2024-09-08 LAB — CBC WITH DIFFERENTIAL (CANCER CENTER ONLY)
Abs Immature Granulocytes: 0.05 K/uL (ref 0.00–0.07)
Basophils Absolute: 0 K/uL (ref 0.0–0.1)
Basophils Relative: 1 %
Eosinophils Absolute: 0.2 K/uL (ref 0.0–0.5)
Eosinophils Relative: 2 %
HCT: 35.3 % — ABNORMAL LOW (ref 36.0–46.0)
Hemoglobin: 11.3 g/dL — ABNORMAL LOW (ref 12.0–15.0)
Immature Granulocytes: 1 %
Lymphocytes Relative: 28 %
Lymphs Abs: 2.1 K/uL (ref 0.7–4.0)
MCH: 28.7 pg (ref 26.0–34.0)
MCHC: 32 g/dL (ref 30.0–36.0)
MCV: 89.6 fL (ref 80.0–100.0)
Monocytes Absolute: 0.5 K/uL (ref 0.1–1.0)
Monocytes Relative: 6 %
Neutro Abs: 4.6 K/uL (ref 1.7–7.7)
Neutrophils Relative %: 62 %
Platelet Count: 212 K/uL (ref 150–400)
RBC: 3.94 MIL/uL (ref 3.87–5.11)
RDW: 15.4 % (ref 11.5–15.5)
WBC Count: 7.4 K/uL (ref 4.0–10.5)
nRBC: 0 % (ref 0.0–0.2)

## 2024-09-08 LAB — IRON AND TIBC
Iron: 49 ug/dL (ref 28–170)
Saturation Ratios: 12 % (ref 10.4–31.8)
TIBC: 417 ug/dL (ref 250–450)
UIBC: 368 ug/dL

## 2024-09-08 LAB — RETIC PANEL
Immature Retic Fract: 14.2 % (ref 2.3–15.9)
RBC.: 3.94 MIL/uL (ref 3.87–5.11)
Retic Count, Absolute: 52.8 K/uL (ref 19.0–186.0)
Retic Ct Pct: 1.3 % (ref 0.4–3.1)
Reticulocyte Hemoglobin: 31.8 pg (ref >27.9)

## 2024-09-08 LAB — FERRITIN: Ferritin: 12 ng/mL (ref 11–307)

## 2024-09-08 NOTE — Assessment & Plan Note (Signed)
Mild anemia, due to CKD, stable.

## 2024-09-08 NOTE — Assessment & Plan Note (Addendum)
 LCIS, currently on chemoprevention with tamoxifen .-History of hysterectomy Labs reviewed and discussed with patient S/p 5 years of tamoxifen  20 mg daily. off tamoxifen  . She stayed on Effexor  as it helps her headache. Currently on Effexor  37.5 mg daily - managed by PCP She will remain on Aspirin 81mg  for gout and other medical problems.   #History of hormone replacement.  Personal history of LCIS. Tyrer Cuzick risk model -lifetime risk 38.7%, personal 10-year risk 25% Continue annual bilateral mammogram and bilateral Breast MRI

## 2024-09-08 NOTE — Assessment & Plan Note (Signed)
Avoid nephrotoxins.  Encourage oral hydration  

## 2024-09-08 NOTE — Progress Notes (Signed)
 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 off tamoxifen  . She stayed on Effexor  as it helps her headache. Currently on Effexor  37.5 mg daily - managed by PCP She will remain on Aspirin 81mg  for gout and other medical problems.   #History of hormone replacement.  Personal history of LCIS. Tyrer Cuzick risk model -lifetime risk 38.7%, personal 10-year risk 25% Continue annual bilateral mammogram and bilateral Breast MRI  Anemia in CKD (chronic kidney disease) Mild anemia, due to CKD, stable.   Stage 3a chronic kidney disease (HCC) Avoid nephrotoxins. Encourage oral hydration  Orders Placed This Encounter  Procedures   MR BREAST BILATERAL W WO CONTRAST INC CAD    Standing Status:   Future    Expected Date:   12/29/2024    Expiration Date:   09/08/2025    If indicated for the ordered procedure, I authorize the administration of contrast media per Radiology protocol:   Yes    What is the patient's sedation requirement?:   No Sedation    Does the patient have a pacemaker or implanted devices?:   No    Radiology Contrast Protocol - do NOT remove file path:   [email or web address]    Preferred imaging location?:   [facility] (table limit - 500lbs)   MM 3D SCREENING MAMMOGRAM BILATERAL BREAST    Standing Status:   Future    Expected Date:   09/08/2025    Expiration Date:   09/08/2025    Reason for Exam (SYMPTOM  OR DIAGNOSIS REQUIRED):   Lobular carcinoma in situ (LCIS) of left breast    Preferred imaging location?:   [facility]   CMP  (Cancer Center only)    Standing Status:   Future    Expected Date:   09/08/2025    Expiration Date:   12/07/2025   CBC with Differential (Cancer Center Only)    Standing Status:   Future    Expected Date:   09/08/2025    Expiration Date:   12/07/2025   Iron and TIBC    Standing Status:   Future    Expected Date:   09/08/2025    Expiration Date:   12/07/2025   Ferritin    Standing Status:   Future    Expected Date:   09/08/2025    Expiration Date:   12/07/2025   Retic Panel    Standing Status:   Future    Expected Date:   09/08/2025    Expiration Date:   12/07/2025   Follow up in 6 months.  All questions were answered. The patient knows to call the clinic with any problems, questions or concerns.  Call Babara, MD, PhD [facility] Hematology Oncology 09/08/2024    CHIEF COMPLAINTS/REASON FOR VISIT:  LCIS, management of endocrinology therapy, breast cancer screening for high risk patient.  HISTORY OF PRESENTING ILLNESS:   Aimee Hill is a  73 y.o.  female with PMH listed below was seen in consultation at the request of  Diner, Jamie, FNP  for evaluation of LCIS, management of endocrinology therapy Previously managed by Dr.Byrnett. later she switched to Dr.Piscoya   Oncology History  Lobular  carcinoma in situ (LCIS) of left breast  08/06/2018 Initial Diagnosis   Lobular carcinoma in situ (LCIS) of left breast  07/22/2018 left breast biopsy showed complex sclerosis lesion with atypical lobular hyperplasia, duct ectasia.  08/06/2018 left breast excisional biopsy showed focus of LCIS arising from complex  sclerosis lesion with intraductal papillomas. Calcification associated with CSL and LCIS. cystic papillary aprocrine metaplasia.    08/13/2018 - 11/26/2023 Anti-estrogen oral therapy   Finished 5+ year of tamoxifen  20mg  daily.    02/11/2022 Imaging    bilateral breast MRI showed no MRI evidence to suggest malignancy.   08/28/2022 Mammogram   Bilateral screening mammogram  showed No mammographic evidence of malignancy. A result letter of this screening mammogram will be mailed directly to the patient.   06/12/2023 Imaging   Bilateral MRI breast with and without contrast negative   09/01/2023 Mammogram   Bilateral screening mammogram negative.   08/30/2024 Mammogram   Bilateral screening mammogram FINDINGS: In the right breast, a possible asymmetry warrants further evaluation. In the left breast, no findings suspicious for malignancy.   IMPRESSION: Further evaluation is suggested for possible asymmetry in the right breast.    09/08/2024 Imaging   Unilateral right breast diagnostic mammogram showed FINDINGS: Additional spot compression CC and true lateral images of the right breast with tomographic imaging were obtained. There is no focal abnormality over the right retroareolar region.   IMPRESSION: No focal abnormality over the right retroareolar region.       Hospitalization 07/23/22 due to septic arthritis  Hypercalcemia [facility] versus primary hyperparathyroidism, she follows up with [facility] endocrinologist.   INTERVAL HISTORY Aimee Hill is a 73 y.o. female who has above history reviewed by me today presents for follow up visit for management of LCIS.  Problems and complaints are listed below: Patient has been on tamoxifen  20 mg daily.  She take Effexor  37.5mg  BID, her vasomotor symptoms are managed well.  She also feels that Effexor  helps to decrease headache frequency. She has no new breast concerns.  She had screening mammogram and right breast diagnostic mammogram.    Review of Systems  Constitutional:  Negative for appetite change, chills, fatigue and fever.  HENT:   Negative for hearing loss and voice change.   Eyes:  Negative for eye problems.  Respiratory:  Negative for chest tightness and cough.   Cardiovascular:  Negative for chest pain.  Gastrointestinal:  Negative for abdominal distention, abdominal pain and blood in stool.   Genitourinary:  Negative for difficulty urinating and frequency.   Musculoskeletal:  Positive for arthralgias.  Skin:  Negative for itching and rash.  Neurological:  Negative for extremity weakness.  Hematological:  Negative for adenopathy.  Psychiatric/Behavioral:  Negative for confusion.     MEDICAL HISTORY:  Past Medical History:  Diagnosis Date   Complication of anesthesia    TAKES MORE TO SEDATE HER   Family history of adverse reaction to anesthesia    SISTER N/V   Fracture 2013   left foot and second and third toes on left foot   GERD (gastroesophageal reflux disease)    Headache    History of kidney stones    Hyperlipidemia    Hypertension    Hypothyroidism    Osteoarthritis    PONV (postoperative nausea and vomiting)    Urinary calculus     SURGICAL HISTORY: Past Surgical History:  Procedure Laterality Date   BREAST BIOPSY Left 07/22/2018   affirm bx coil marker path pending   BREAST BIOPSY  Left 08/06/2018   Procedure: BREAST BIOPSY;  Surgeon: Dessa Reyes ORN, MD;  Location: [facility] ORS;  Service: General;  Laterality: Left;   BREAST EXCISIONAL BIOPSY Left 08/06/2018   lumpectomy atypical lobular hyperplasia    BREAST LUMPECTOMY Left 08/10/2018   ADH removed   COLONOSCOPY  2013   negative/Dr. 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 Hazel   TUBAL LIGATION     URETERAL STENT PLACEMENT  1997    SOCIAL HISTORY: Social History   Socioeconomic History   Marital status: Married    Spouse name: Not on file   Number of children: 1   Years of education: 14   Highest education level: Not on file  Occupational History   Occupation: [employer] Services operational officer  Tobacco Use   Smoking status: Never   Smokeless tobacco: Never  Vaping Use   Vaping status: Never Used  Substance and Sexual  Activity   Alcohol use: No   Drug use: No   Sexual activity: Yes    Partners: Male    Birth control/protection: Surgical  Other Topics Concern   Not on file  Social History Narrative   Not on file   Social Drivers of Health   Financial Resource Strain: Low Risk  (07/24/2022)   Received from [facility]   Overall Financial Resource Strain (CARDIA)    Difficulty of Paying Living Expenses: Not hard at all  Food Insecurity: No Food Insecurity (09/01/2024)   Received from [facility]   Hunger Vital Sign    Within the past 12 months, you worried that your food would run out before you got the money to buy more.: Never true    Within the past 12 months, the food you bought just didn't last and you didn't have money to get more.: Never true  Transportation Needs: No Transportation Needs (09/01/2024)   Received from [facility]   PRAPARE - Transportation    Lack of Transportation (Medical): No    Lack of Transportation (Non-Medical): No  Physical Activity: Insufficiently Active (11/06/2020)   Received from [facility]   Exercise Vital Sign    On average, how many days per week do you engage in moderate to strenuous exercise (like a brisk walk)?: 7 days    On average, how many minutes do you engage in exercise at this level?: 20 min  Stress: No Stress Concern Present (01/24/2023)   Received from [facility] - Occupational Stress Questionnaire    Feeling of Stress : Not at all  Social Connections: Unknown (05/01/2022)   Received from [facility]   Social Network    Social Network: Not on file  Intimate Partner Violence: Unknown (03/27/2022)   Received from [facility]   HITS    Physically Hurt: Not on file    Insult or Talk Down To: Not on file    Threaten Physical Harm: Not on file    Scream or Curse: Not on file    FAMILY HISTORY: Family History  Problem Relation Age of Onset   Alzheimer's disease Mother        died  at age 13 from internal bleeding   Rheum arthritis Mother    Brain cancer Father 62   Lymphoma Paternal Aunt 37  Colon cancer Paternal Aunt    Breast cancer Neg Hx     ALLERGIES:  has no known allergies.  MEDICATIONS:  Current Outpatient Medications  Medication Sig Dispense Refill   allopurinol (ZYLOPRIM) 100 MG tablet Take 100 mg by mouth daily.     Cholecalciferol (VITAMIN D3) 100000 UNIT/GM POWD Take by mouth.     levothyroxine (SYNTHROID, LEVOTHROID) 50 MCG tablet Take 50 mcg by mouth daily before breakfast.   10   Multiple Vitamins-Minerals (MULTI-DAY PLUS MINERALS) TABS Take 1 tablet by mouth daily.     omeprazole (PRILOSEC) 20 MG capsule Take 20 mg by mouth 2 (two) times daily.      pravastatin (PRAVACHOL) 10 MG tablet Take 10 mg by mouth every morning.   10   venlafaxine  (EFFEXOR ) 37.5 MG tablet Take 1 tablet (37.5 mg total) by mouth 2 (two) times daily. 60 tablet 5   verapamil (CALAN-SR) 120 MG CR tablet Take 120 mg by mouth every morning.     colchicine 0.6 MG tablet Take 0.6 mg by mouth daily. (Patient not taking: Reported on 09/08/2024)     valACYclovir (VALTREX) 1000 MG tablet Take 1,000 mg by mouth daily as needed. (Patient not taking: Reported on 09/08/2024)     No current facility-administered medications for this visit.     PHYSICAL EXAMINATION: ECOG PERFORMANCE STATUS: 1 - Symptomatic but completely ambulatory Vitals:   09/08/24 1028  Pulse: 81  Resp: 18  Temp: (!) 97 F (36.1 C)  SpO2: 98%   Filed Weights   09/08/24 1028  Weight: 187 lb (84.8 kg)    Physical Exam Constitutional:      General: She is not in acute distress. HENT:     Head: Normocephalic and atraumatic.  Eyes:     General: No scleral icterus.    Pupils: Pupils are equal, round, and reactive to light.  Cardiovascular:     Rate and Rhythm: Normal rate and regular rhythm.     Heart sounds: Normal heart sounds.  Pulmonary:     Effort: Pulmonary effort is normal. No respiratory  distress.     Breath sounds: No wheezing.  Abdominal:     General: Bowel sounds are normal. There is no distension.     Palpations: Abdomen is soft. There is no mass.     Tenderness: There is no abdominal tenderness.  Musculoskeletal:        General: No deformity. Normal range of motion.     Cervical back: Normal range of motion and neck supple.  Skin:    General: Skin is warm and dry.     Findings: No erythema or rash.  Neurological:     Mental Status: She is alert and oriented to person, place, and time.     Cranial Nerves: No cranial nerve deficit.     Coordination: Coordination normal.  Psychiatric:        Behavior: Behavior normal.        Thought Content: Thought content normal.       LABORATORY DATA:  I have reviewed the data as listed    Latest Ref Rng & Units 09/08/2024   10:14 AM 11/26/2023   10:04 AM 05/13/2023   10:07 AM  CBC  WBC 4.0 - 10.5 K/uL 7.4  7.1  6.8   Hemoglobin 12.0 - 15.0 g/dL 88.6  88.8  88.7   Hematocrit 36.0 - 46.0 % 35.3  33.9  34.6   Platelets 150 - 400 K/uL 212  222  205  RADIOGRAPHIC STUDIES: I have personally reviewed the radiological images as listed and agreed with the findings in the report. MM 3D DIAGNOSTIC MAMMOGRAM UNILATERAL RIGHT BREAST Result Date: 09/08/2024 CLINICAL DATA:  Recall from screening to evaluate a possible right breast asymmetry. EXAM: DIGITAL DIAGNOSTIC UNILATERAL RIGHT MAMMOGRAM WITH TOMOSYNTHESIS AND CAD TECHNIQUE: Right digital diagnostic mammography and breast tomosynthesis was performed. The images were evaluated with computer-aided detection. COMPARISON:  Previous exam(s). ACR Breast Density Category c: The breasts are heterogeneously dense, which may obscure small masses. FINDINGS: Additional spot compression CC and true lateral images of the right breast with tomographic imaging were obtained. There is no focal abnormality over the right retroareolar region. IMPRESSION: No focal abnormality over the right  retroareolar region. RECOMMENDATION: Recommend continued annual bilateral screening mammographic follow-up. I have discussed the findings and recommendations with the patient. If applicable, a reminder letter will be sent to the patient regarding the next appointment. BI-RADS CATEGORY  1: Negative. Electronically Signed   By: Toribio Agreste M.D.   On: 09/08/2024 08:30   MM 3D SCREENING MAMMOGRAM BILATERAL BREAST Result Date: 09/02/2024 CLINICAL DATA:  Screening. EXAM: DIGITAL SCREENING BILATERAL MAMMOGRAM WITH TOMOSYNTHESIS AND CAD TECHNIQUE: Bilateral screening digital craniocaudal and mediolateral oblique mammograms were obtained. Bilateral screening digital breast tomosynthesis was performed. The images were evaluated with computer-aided detection. COMPARISON:  Previous exam(s). ACR Breast Density Category c: The breasts are heterogeneously dense, which may obscure small masses. FINDINGS: In the right breast, a possible asymmetry warrants further evaluation. In the left breast, no findings suspicious for malignancy. IMPRESSION: Further evaluation is suggested for possible asymmetry in the right breast. RECOMMENDATION: Diagnostic mammogram and possibly ultrasound of the right breast. (Code:[id number]-[id number]) The patient will be contacted regarding the findings, and additional imaging will be scheduled. BI-RADS CATEGORY  0: Incomplete: Need additional imaging evaluation. Electronically Signed   By: Inocente Ast M.D.   On: 09/02/2024 15:15

## 2024-12-29 ENCOUNTER — Ambulatory Visit

## 2025-01-06 ENCOUNTER — Ambulatory Visit
Admission: RE | Admit: 2025-01-06 | Discharge: 2025-01-06 | Disposition: A | Discharge status: Home / Self Care | Source: Ambulatory Visit | Attending: Oncology | Admitting: Oncology

## 2025-01-06 DIAGNOSIS — D0502 Lobular carcinoma in situ of left breast: Secondary | ICD-10-CM | POA: Diagnosis present

## 2025-01-06 MED ORDER — GADOBUTROL 1 MMOL/ML IV SOLN
7.5000 mL | Freq: Once | INTRAVENOUS | Status: AC | PRN
  Administered 2025-01-06: 7.5 mL via INTRAVENOUS

## 2025-01-18 ENCOUNTER — Ambulatory Visit: Payer: Self-pay | Admitting: Oncology

## 2025-01-19 ENCOUNTER — Other Ambulatory Visit: Payer: Self-pay

## 2025-01-19 NOTE — Progress Notes (Signed)
 Mammo ordered. [facility] will contact pt to schedule mammo closer to Sept .

## 2025-01-19 NOTE — Telephone Encounter (Signed)
-----   Message from Zelphia Cap, MD sent at 01/18/2025  9:07 PM EST ----- Please arrange her to get mammogram screening bilaterally in Sept, prior to her visit with me.

## 2025-09-15 ENCOUNTER — Ambulatory Visit: Admitting: Oncology

## 2025-09-15 ENCOUNTER — Other Ambulatory Visit
